# Patient Record
Sex: Male | Born: 1960 | Race: White | Hispanic: No | Marital: Married | Smoking: Former smoker
Health system: Southern US, Community
[De-identification: ages and names within clinical notes are randomized; demographics above are authoritative.]

## PROBLEM LIST (undated history)

## (undated) DIAGNOSIS — I10 Essential (primary) hypertension: Secondary | ICD-10-CM

## (undated) DIAGNOSIS — N201 Calculus of ureter: Secondary | ICD-10-CM

## (undated) DIAGNOSIS — M1990 Unspecified osteoarthritis, unspecified site: Secondary | ICD-10-CM

## (undated) DIAGNOSIS — J45909 Unspecified asthma, uncomplicated: Secondary | ICD-10-CM

## (undated) DIAGNOSIS — E7800 Pure hypercholesterolemia, unspecified: Secondary | ICD-10-CM

## (undated) DIAGNOSIS — Z87442 Personal history of urinary calculi: Secondary | ICD-10-CM

## (undated) DIAGNOSIS — J302 Other seasonal allergic rhinitis: Secondary | ICD-10-CM

## (undated) HISTORY — PX: CARPAL TUNNEL RELEASE: SHX101

## (undated) HISTORY — PX: KNEE SURGERY: SHX244

## (undated) HISTORY — PX: BACK SURGERY: SHX140

## (undated) HISTORY — PX: SHOULDER ARTHROSCOPY: SHX128

---

## 2006-09-13 ENCOUNTER — Emergency Department (HOSPITAL_COMMUNITY): Admission: EM | Admit: 2006-09-13 | Discharge: 2006-09-13 | Payer: Self-pay | Admitting: Family Medicine

## 2007-01-04 ENCOUNTER — Emergency Department (HOSPITAL_COMMUNITY): Admission: EM | Admit: 2007-01-04 | Discharge: 2007-01-04 | Payer: Self-pay | Admitting: Family Medicine

## 2007-03-18 ENCOUNTER — Emergency Department (HOSPITAL_COMMUNITY): Admission: EM | Admit: 2007-03-18 | Discharge: 2007-03-18 | Payer: Self-pay | Admitting: Emergency Medicine

## 2007-05-13 ENCOUNTER — Emergency Department (HOSPITAL_COMMUNITY): Admission: EM | Admit: 2007-05-13 | Discharge: 2007-05-13 | Payer: Self-pay | Admitting: Family Medicine

## 2008-04-07 ENCOUNTER — Emergency Department (HOSPITAL_COMMUNITY): Admission: EM | Admit: 2008-04-07 | Discharge: 2008-04-07 | Payer: Self-pay | Admitting: Emergency Medicine

## 2010-08-29 ENCOUNTER — Emergency Department (HOSPITAL_BASED_OUTPATIENT_CLINIC_OR_DEPARTMENT_OTHER)
Admission: EM | Admit: 2010-08-29 | Discharge: 2010-08-30 | Payer: Self-pay | Source: Home / Self Care | Admitting: Emergency Medicine

## 2010-09-03 LAB — URINALYSIS, ROUTINE W REFLEX MICROSCOPIC
Bilirubin Urine: NEGATIVE
Hgb urine dipstick: NEGATIVE
Ketones, ur: NEGATIVE mg/dL
Nitrite: NEGATIVE
Protein, ur: NEGATIVE mg/dL
Specific Gravity, Urine: 1.019 (ref 1.005–1.030)
Urine Glucose, Fasting: NEGATIVE mg/dL
Urobilinogen, UA: 0.2 mg/dL (ref 0.0–1.0)
pH: 6.5 (ref 5.0–8.0)

## 2015-02-14 ENCOUNTER — Encounter (HOSPITAL_BASED_OUTPATIENT_CLINIC_OR_DEPARTMENT_OTHER): Payer: Self-pay

## 2015-02-14 ENCOUNTER — Emergency Department (HOSPITAL_BASED_OUTPATIENT_CLINIC_OR_DEPARTMENT_OTHER): Payer: 59

## 2015-02-14 ENCOUNTER — Emergency Department (HOSPITAL_BASED_OUTPATIENT_CLINIC_OR_DEPARTMENT_OTHER)
Admission: EM | Admit: 2015-02-14 | Discharge: 2015-02-14 | Disposition: A | Discharge status: Home / Self Care | Payer: 59 | Attending: Emergency Medicine | Admitting: Emergency Medicine

## 2015-02-14 DIAGNOSIS — Y998 Other external cause status: Secondary | ICD-10-CM | POA: Diagnosis not present

## 2015-02-14 DIAGNOSIS — Y9289 Other specified places as the place of occurrence of the external cause: Secondary | ICD-10-CM | POA: Diagnosis not present

## 2015-02-14 DIAGNOSIS — Z87891 Personal history of nicotine dependence: Secondary | ICD-10-CM | POA: Insufficient documentation

## 2015-02-14 DIAGNOSIS — X58XXXA Exposure to other specified factors, initial encounter: Secondary | ICD-10-CM | POA: Insufficient documentation

## 2015-02-14 DIAGNOSIS — Y9389 Activity, other specified: Secondary | ICD-10-CM | POA: Diagnosis not present

## 2015-02-14 DIAGNOSIS — Z79899 Other long term (current) drug therapy: Secondary | ICD-10-CM | POA: Diagnosis not present

## 2015-02-14 DIAGNOSIS — S4991XA Unspecified injury of right shoulder and upper arm, initial encounter: Secondary | ICD-10-CM | POA: Diagnosis not present

## 2015-02-14 DIAGNOSIS — J45909 Unspecified asthma, uncomplicated: Secondary | ICD-10-CM | POA: Insufficient documentation

## 2015-02-14 HISTORY — DX: Other seasonal allergic rhinitis: J30.2

## 2015-02-14 HISTORY — DX: Unspecified asthma, uncomplicated: J45.909

## 2015-02-14 MED ORDER — HYDROCODONE-ACETAMINOPHEN 5-325 MG PO TABS: 1.0000 | ORAL_TABLET | ORAL | Status: DC | PRN

## 2015-02-14 MED ORDER — FENTANYL CITRATE (PF) 100 MCG/2ML IJ SOLN
100.0000 ug | Freq: Once | INTRAMUSCULAR | Status: AC
  Administered 2015-02-14: 100 ug via NASAL
  Filled 2015-02-14: qty 2

## 2015-02-14 NOTE — ED Notes (Signed)
Pt reports today with fall onto R shoulder, limited ROM in same since this time, numbness to fingers.

## 2015-02-14 NOTE — ED Provider Notes (Signed)
CSN: [id number]     Arrival date & time 02/14/15  1759 History   First MD Initiated Contact with Patient 02/14/15 1806     Chief Complaint  Patient presents with  . Shoulder Injury     (Consider location/radiation/quality/duration/timing/severity/associated sxs/prior Treatment) HPI Comments: 54 y/o M c/o R shoulder pain since 4 PM when he accidentally slipped while he had a backpack on his back trying to step over his tractor trailer. States he grabbed the tractor trailer with his right arm and was "basically hanging" from the tractor trailer when he felt a pop. Pain began immediately, radiating from his shoulder down his arm rated 8/10, worse when he tries to lift or twist his arm. No alleviating factors tried. Admits to numbness and tingling into his fingers. Denies neck or back pain, skin color change.  Patient is a 54 y.o. male presenting with shoulder injury. The history is provided by the patient.  Shoulder Injury This is a new problem. The current episode started today. The problem has been unchanged. Associated symptoms include numbness. Exacerbated by: lifting arm. He has tried nothing for the symptoms.    Past Medical History  Diagnosis Date  . Asthma   . Seasonal allergies    Past Surgical History  Procedure Laterality Date  . Knee surgery    . Carpal tunnel release     No family history on file. History  Substance Use Topics  . Smoking status: Former Games developermoker  . Smokeless tobacco: Not on file  . Alcohol Use: Yes     Comment: occ    Review of Systems  Musculoskeletal:       + R shoulder/arm pain.  Neurological: Positive for numbness.  All other systems reviewed and are negative.     Allergies  Review of patient's allergies indicates no known allergies.  Home Medications   Prior to Admission medications   Medication Sig Start Date End Date Taking? Authorizing Provider  cetirizine-pseudoephedrine (ZYRTEC-D) 5-120 MG per tablet Take 1 tablet by mouth 2 (two)  times daily.   Yes Historical Provider, MD  HYDROcodone-acetaminophen (NORCO/VICODIN) 5-325 MG per tablet Take 1-2 tablets by mouth every 4 (four) hours as needed. 02/14/15   Shaneya Taketa M Kaydyn Sayas, PA-C  montelukast (SINGULAIR) 10 MG tablet Take 10 mg by mouth at bedtime.   Yes Historical Provider, MD   BP 153/99 mmHg  Pulse 92  Temp(Src) 98.1 F (36.7 C) (Oral)  Resp 18  Ht 5\' 9"  (1.753 m)  Wt 210 lb (95.255 kg)  BMI 31.00 kg/m2  SpO2 100% Physical Exam  Constitutional: He is oriented to person, place, and time. He appears well-developed and well-nourished. No distress.  HENT:  Head: Normocephalic and atraumatic.  Eyes: Conjunctivae and EOM are normal.  Neck: Normal range of motion. Neck supple.  Cardiovascular: Normal rate, regular rhythm and normal heart sounds.   Pulmonary/Chest: Effort normal and breath sounds normal.  Musculoskeletal:  R arm TTP mid-proximal humerus and lateral aspect of shoulder. No deformity. ROM limited due to pain. Pain in shoulder increased when attempting shoulder abduction. Pain into humerus increased with external rotation. Elbow normal. Normal grip strength. +2 radial pulse. Cap refill < 3 seconds.  Neurological: He is alert and oriented to person, place, and time. No sensory deficit.  Skin: Skin is warm and dry.  Psychiatric: He has a normal mood and affect. His behavior is normal.  Nursing note and vitals reviewed.   ED Course  Procedures (including critical care time) Labs Review Labs  Reviewed - No data to display  Imaging Review Dg Shoulder Right  02/14/2015   CLINICAL DATA:  54 year old male status post fall.  EXAM: RIGHT SHOULDER - 2+ VIEW  COMPARISON:  None.  FINDINGS: There is no evidence of fracture or dislocation. There is no evidence of arthropathy or other focal bone abnormality. Soft tissues are unremarkable.  There is apparent increased opacity of the central portion of the visualized right lung on the frontal projection. This is not well  evaluated and may be artifactual. Correlation with chest radiograph recommended for better evaluation.  IMPRESSION: No fracture or dislocation of the right shoulder.   Electronically Signed   By: Elgie Collard M.D.   On: 02/14/2015 19:07   Dg Humerus Right  02/14/2015   CLINICAL DATA:  Status post fall, with limited range of motion and right arm pain. Initial encounter.  EXAM: RIGHT HUMERUS - 2+ VIEW  COMPARISON:  None.  FINDINGS: There is no evidence of fracture or dislocation. The humerus appears intact. The right humeral head remains seated at the glenoid fossa. The elbow joint is grossly unremarkable. No elbow joint effusion is identified. An enthesophyte is seen arising at the olecranon.  A tiny osseous fragment at the radial epicondyle likely reflects remote injury. No definite soft tissue abnormalities are characterized on radiograph. Mild degenerative change is noted at the right acromioclavicular joint.  IMPRESSION: No evidence of fracture or dislocation.   Electronically Signed   By: Roanna Raider M.D.   On: 02/14/2015 19:09     EKG Interpretation None      MDM   Final diagnoses:  Right shoulder injury, initial encounter   Neurovascularly intact. X-ray negative. Sling applied for comfort. Cannot exclude rotator cuff injury. He is seen [facility] in the past. Advised follow-up if no improvement by the end of the week. Rest, ice and NSAIDs. Vicodin for pain. Stable for discharge. Return precautions given. Patient states understanding of treatment care plan and is agreeable.    Kathrynn Speed, PA-C 02/14/15 1923  Mirian Mo, MD 02/17/15 [phone]

## 2015-02-14 NOTE — ED Notes (Signed)
PA at the bedside.

## 2015-02-14 NOTE — Discharge Instructions (Signed)
Take Vicodin for severe pain only. No driving or operating heavy machinery while taking vicodin. This medication may cause drowsiness. He should also take ibuprofen or naproxen along with the Vicodin. Rest, avoid heavy lifting or hard physical activity for the next few days. Use the sling for comfort. Follow-up with your orthopedist if no improvement by the end of the week.  Shoulder Pain The shoulder is the joint that connects your arms to your body. The bones that form the shoulder joint include the upper arm bone (humerus), the shoulder blade (scapula), and the collarbone (clavicle). The top of the humerus is shaped like a ball and fits into a rather flat socket on the scapula (glenoid cavity). A combination of muscles and strong, fibrous tissues that connect muscles to bones (tendons) support your shoulder joint and hold the ball in the socket. Small, fluid-filled sacs (bursae) are located in different areas of the joint. They act as cushions between the bones and the overlying soft tissues and help reduce friction between the gliding tendons and the bone as you move your arm. Your shoulder joint allows a wide range of motion in your arm. This range of motion allows you to do things like scratch your back or throw a ball. However, this range of motion also makes your shoulder more prone to pain from overuse and injury. Causes of shoulder pain can originate from both injury and overuse and usually can be grouped in the following four categories:  Redness, swelling, and pain (inflammation) of the tendon (tendinitis) or the bursae (bursitis).  Instability, such as a dislocation of the joint.  Inflammation of the joint (arthritis).  Broken bone (fracture). HOME CARE INSTRUCTIONS   Apply ice to the sore area.  Put ice in a plastic bag.  Place a towel between your skin and the bag.  Leave the ice on for 15-20 minutes, 3-4 times per day for the first 2 days, or as directed by your health care  provider.  Stop using cold packs if they do not help with the pain.  If you have a shoulder sling or immobilizer, wear it as long as your caregiver instructs. Only remove it to shower or bathe. Move your arm as little as possible, but keep your hand moving to prevent swelling.  Squeeze a soft ball or foam pad as much as possible to help prevent swelling.  Only take over-the-counter or prescription medicines for pain, discomfort, or fever as directed by your caregiver. SEEK MEDICAL CARE IF:   Your shoulder pain increases, or new pain develops in your arm, hand, or fingers.  Your hand or fingers become cold and numb.  Your pain is not relieved with medicines. SEEK IMMEDIATE MEDICAL CARE IF:   Your arm, hand, or fingers are numb or tingling.  Your arm, hand, or fingers are significantly swollen or turn white or blue. MAKE SURE YOU:   Understand these instructions.  Will watch your condition.  Will get help right away if you are not doing well or get worse. Document Released: 05/08/2005 Document Revised: 12/13/2013 Document Reviewed: 07/13/2011 [facility] Patient Information 2015 [place], [place]. This information is not intended to replace advice given to you by your health care provider. Make sure you discuss any questions you have with your health care provider.

## 2015-02-14 NOTE — ED Notes (Signed)
Patient transported to X-ray 

## 2015-02-14 NOTE — ED Notes (Signed)
Patient returned from X-ray 

## 2018-10-02 ENCOUNTER — Emergency Department (HOSPITAL_BASED_OUTPATIENT_CLINIC_OR_DEPARTMENT_OTHER): Payer: 59

## 2018-10-02 ENCOUNTER — Telehealth: Payer: Self-pay | Admitting: *Deleted

## 2018-10-02 ENCOUNTER — Encounter (HOSPITAL_BASED_OUTPATIENT_CLINIC_OR_DEPARTMENT_OTHER): Payer: Self-pay | Admitting: Emergency Medicine

## 2018-10-02 ENCOUNTER — Other Ambulatory Visit: Payer: Self-pay

## 2018-10-02 ENCOUNTER — Emergency Department (HOSPITAL_BASED_OUTPATIENT_CLINIC_OR_DEPARTMENT_OTHER)
Admission: EM | Admit: 2018-10-02 | Discharge: 2018-10-02 | Disposition: A | Discharge status: Home / Self Care | Payer: 59 | Attending: Emergency Medicine | Admitting: Emergency Medicine

## 2018-10-02 DIAGNOSIS — R1084 Generalized abdominal pain: Secondary | ICD-10-CM | POA: Diagnosis present

## 2018-10-02 DIAGNOSIS — J45909 Unspecified asthma, uncomplicated: Secondary | ICD-10-CM | POA: Diagnosis not present

## 2018-10-02 DIAGNOSIS — Z87891 Personal history of nicotine dependence: Secondary | ICD-10-CM | POA: Insufficient documentation

## 2018-10-02 DIAGNOSIS — N201 Calculus of ureter: Secondary | ICD-10-CM | POA: Diagnosis not present

## 2018-10-02 HISTORY — DX: Calculus of ureter: N20.1

## 2018-10-02 HISTORY — DX: Pure hypercholesterolemia, unspecified: E78.00

## 2018-10-02 LAB — URINALYSIS, MICROSCOPIC (REFLEX): RBC / HPF: >50 RBC/hpf (ref 0–5)

## 2018-10-02 LAB — URINALYSIS, ROUTINE W REFLEX MICROSCOPIC
Bilirubin Urine: NEGATIVE
Glucose, UA: NEGATIVE mg/dL
Ketones, ur: NEGATIVE mg/dL
Leukocytes,Ua: NEGATIVE
Nitrite: NEGATIVE
Protein, ur: NEGATIVE mg/dL
Specific Gravity, Urine: >1.03 — ABNORMAL HIGH (ref 1.005–1.030)
pH: 6 (ref 5.0–8.0)

## 2018-10-02 MED ORDER — TAMSULOSIN HCL 0.4 MG PO CAPS
0.4000 mg | ORAL_CAPSULE | Freq: Once | ORAL | Status: AC
  Administered 2018-10-02: 0.4 mg via ORAL
  Filled 2018-10-02: qty 1

## 2018-10-02 MED ORDER — HYDROMORPHONE HCL 1 MG/ML IJ SOLN
1.0000 mg | Freq: Once | INTRAMUSCULAR | Status: AC
  Administered 2018-10-02: 1 mg via INTRAVENOUS
  Filled 2018-10-02: qty 1

## 2018-10-02 MED ORDER — ONDANSETRON 8 MG PO TBDP: 8.0000 mg | ORAL_TABLET | Freq: Three times a day (TID) | ORAL | 1 refills | Status: DC | PRN

## 2018-10-02 MED ORDER — HYDROMORPHONE HCL 1 MG/ML IJ SOLN
1.0000 mg | Freq: Once | INTRAMUSCULAR | Status: AC
Start: 2018-10-02 — End: 2018-10-02
  Administered 2018-10-02: 1 mg via INTRAVENOUS
  Filled 2018-10-02: qty 1

## 2018-10-02 MED ORDER — TAMSULOSIN HCL 0.4 MG PO CAPS: ORAL_CAPSULE | ORAL | 0 refills | Status: DC

## 2018-10-02 MED ORDER — HYDROMORPHONE HCL 4 MG PO TABS: 2.0000 mg | ORAL_TABLET | ORAL | 0 refills | Status: DC | PRN

## 2018-10-02 MED ORDER — ONDANSETRON HCL 4 MG/2ML IJ SOLN
4.0000 mg | Freq: Once | INTRAMUSCULAR | Status: AC
  Administered 2018-10-02: 4 mg via INTRAVENOUS
  Filled 2018-10-02: qty 2

## 2018-10-02 NOTE — Telephone Encounter (Signed)
Pt spouse called in regards to 4mg  Dilaudid tablets prescribed. Wife states tablets at really small and they are unable to score them.  They have contacted pharmacy and pharmacy suggested they call ED for new Rx.  EDCM advised that changing narcotic Rx over the phone is not permitted.  EDCM also informed pt that EDP that prescribed Rx was not available as pt was seen at [facility].   Pt wife states they will continue with attempting to score the 4mg  tablet.

## 2018-10-02 NOTE — ED Provider Notes (Signed)
[facility] Provider Note: Joe Dell, MD, FACEP  CSN: [id number] MRN: [id number] ARRIVAL: 10/02/18 at 0053 ROOM: [facility]   CHIEF COMPLAINT  Flank Pain   HISTORY OF PRESENT ILLNESS  10/02/18 1:00 AM Joe [name] is a 58 y.o. male with a history of kidney stones.  He was here with right flank pain radiating to his right lower quadrant for about the past 90 minutes.  The onset was sudden. He rates the pain as severe.  It is not significantly changed with movement or palpation.  He has had associated nausea but no vomiting.  He has not noticed blood in his urine.  Pain is similar to previous kidney stones.   Past Medical History:  Diagnosis Date  . Asthma   . Hypercholesteremia   . Seasonal allergies   . Ureterolithiasis     Past Surgical History:  Procedure Laterality Date  . CARPAL TUNNEL RELEASE    . KNEE SURGERY      No family history on file.  Social History   Tobacco Use  . Smoking status: Former Smoker  Substance Use Topics  . Alcohol use: Yes    Comment: occ  . Drug use: No    Prior to Admission medications   Medication Sig Start Date End Date Taking? Authorizing Provider  montelukast (SINGULAIR) 10 MG tablet Take 10 mg by mouth at bedtime.   Yes [provider]  HYDROmorphone (DILAUDID) 4 MG tablet Take 0.5 tablets (2 mg total) by mouth every 4 (four) hours as needed for severe pain. 10/02/18   Jadalynn Burr, Jonny Ruiz, MD  ondansetron (ZOFRAN ODT) 8 MG disintegrating tablet Take 1 tablet (8 mg total) by mouth every 8 (eight) hours as needed for nausea or vomiting. 10/02/18   Jaicee Michelotti, MD  tamsulosin (FLOMAX) 0.4 MG CAPS capsule Take 1 capsule daily until stone passes. 10/02/18   Florencio Hollibaugh, Jonny Ruiz, MD    Allergies Patient has no known allergies.   REVIEW OF SYSTEMS  Negative except as noted here or in the History of Present Illness.   PHYSICAL EXAMINATION  Initial Vital Signs Blood pressure (!) 202/102, pulse 73, resp. rate 20, height  5\' 9"  (1.753 m), weight 99.3 kg, SpO2 99 %.  Examination General: Well-developed, well-nourished male in no acute distress; appearance consistent with age of record HENT: normocephalic; atraumatic Eyes: pupils equal, round and reactive to light; extraocular muscles intact Neck: supple Heart: regular rate and rhythm Lungs: clear to auscultation bilaterally Abdomen: soft; nondistended; mild right lower quadrant tenderness bowel sounds present GU: No CVA tenderness Extremities: No deformity; full range of motion; pulses normal Neurologic: Awake, alert and oriented; motor function intact in all extremities and symmetric; no facial droop Skin: Warm and dry Psychiatric: Grimacing   RESULTS  Summary of this visit's results, reviewed by myself:   EKG Interpretation  Date/Time:    Ventricular Rate:    PR Interval:    QRS Duration:   QT Interval:    QTC Calculation:   R Axis:     Text Interpretation:        Laboratory Studies: Results for orders placed or performed during the hospital encounter of 10/02/18 (from the past 24 hour(s))  Urinalysis, Routine w reflex microscopic     Status: Abnormal   Collection Time: 10/02/18 12:58 AM  Result Value Ref Range   Color, Urine YELLOW YELLOW   APPearance HAZY (A) CLEAR   Specific Gravity, Urine >1.030 (H) 1.005 - 1.030   pH 6.0 5.0 -  8.0   Glucose, UA NEGATIVE NEGATIVE mg/dL   Hgb urine dipstick LARGE (A) NEGATIVE   Bilirubin Urine NEGATIVE NEGATIVE   Ketones, ur NEGATIVE NEGATIVE mg/dL   Protein, ur NEGATIVE NEGATIVE mg/dL   Nitrite NEGATIVE NEGATIVE   Leukocytes,Ua NEGATIVE NEGATIVE  Urinalysis, Microscopic (reflex)     Status: Abnormal   Collection Time: 10/02/18 12:58 AM  Result Value Ref Range   RBC / HPF >50 0 - 5 RBC/hpf   WBC, UA 0-5 0 - 5 WBC/hpf   Bacteria, UA FEW (A) NONE SEEN   Squamous Epithelial / LPF 0-5 0 - 5   Mucus PRESENT    Imaging Studies: Ct Renal Stone Study  Result Date: 10/02/2018 CLINICAL DATA:   Right-sided flank pain with hematuria EXAM: CT ABDOMEN AND PELVIS WITHOUT CONTRAST TECHNIQUE: Multidetector CT imaging of the abdomen and pelvis was performed following the standard protocol without IV contrast. COMPARISON:  08/29/2010 CT FINDINGS: Lower chest: Lung bases demonstrate no acute consolidation or effusion. The heart size is normal. Hepatobiliary: No focal liver abnormality is seen. No gallstones, gallbladder wall thickening, or biliary dilatation. Pancreas: Unremarkable. No pancreatic ductal dilatation or surrounding inflammatory changes. Spleen: Normal in size without focal abnormality. Adrenals/Urinary Tract: Adrenal glands are normal. No left hydronephrosis. Mild right hydronephrosis, secondary to a 5 mm stone in the proximal right ureter just past the right UPJ. Additional 3 mm stone in the lower pole of the right kidney. Bladder is normal Stomach/Bowel: Stomach is within normal limits. Appendix appears normal. No evidence of bowel wall thickening, distention, or inflammatory changes. Mild sigmoid colon diverticular disease without acute inflammatory change Vascular/Lymphatic: Mild aortic atherosclerosis without aneurysm. No significant adenopathy Reproductive: Prostate is unremarkable. Other: Negative for free air or free fluid. Small fat in the inguinal canals. Musculoskeletal: No acute or significant osseous findings. IMPRESSION: 1. Mild right hydronephrosis, secondary to a 5 mm stone in the proximal right ureter just distal to the right UPJ. 2. Additional small stone in the lower pole of the right kidney 3. Mild sigmoid colon diverticular disease without acute inflammatory process Electronically Signed   By: Jasmine Pang M.D.   On: 10/02/2018 01:41    ED COURSE and MDM  Nursing notes and initial vitals signs, including pulse oximetry, reviewed.  Vitals:   10/02/18 0058 10/02/18 0059  BP:  (!) 202/102  Pulse:  73  Resp:  20  SpO2:  99%  Weight: 99.3 kg   Height: 5\' 9"  (1.753 m)     1:55 AM Pain down to a 5 out of 10.  Will refer patient to urology as his stone is large enough he may not pass it on his own.  PROCEDURES    ED DIAGNOSES     ICD-10-CM   1. Ureterolithiasis N20.1        Mansel Strother, MD 10/02/18 [phone]

## 2018-10-02 NOTE — ED Triage Notes (Signed)
Pt with sudden onset of right flank pain with nausea x 1 hour

## 2018-10-16 ENCOUNTER — Other Ambulatory Visit: Payer: Self-pay | Admitting: Urology

## 2018-10-21 NOTE — H&P (Signed)
Office Visit Report     10/13/2018   --------------------------------------------------------------------------------   Joe [name]  MRN: [id number]  PRIMARY CARE:  Minette Brine, MD  DOB: 06/18/1961, 58 year old Male  REFERRING:  John L. Molpus, MD  SSN:   PROVIDER:  Raynelle Bring, M.D.    TREATING:  Azucena Fallen    LOCATION:  [facility] [phone]   --------------------------------------------------------------------------------   CC/HPI: Right ureteral calculus   10/02/18: Joe [name] is a 58 year old gentleman who presents today having a 1 day history of severe right-sided flank pain with radiation to his right lower quadrant. He presented to [facility] yesterday. A CT scan revealed a 5 mm proximal right ureteral stone. He also had a small lower pole nonobstructing stone on the right side. He has had nausea but no vomiting. He denies any fever. He does have 1 prior stone episode 8 years ago when he spontaneously passed a small 1 mm stone. He does not know his stone type.   Currently, his pain is relatively well controlled with hydromorphone. He has been given tamsulosin and a strainer. He also has a prescription for Zofran.    10/13/18: Joe [name] returns today for follow up. He states that he did pass some small stones in the interim. He denies any current flank or abdominal pain. He state that he did have an episode of left back pain on one occasion, which subsided after a few hours. He denies exacerbation of lower urinary tract symptoms, gross hematuria, fevers, chills, nausea, or vomiting. He denies past cardiac history. He is not on blood thinners.     ALLERGIES: None   MEDICATIONS: Albuterol Sulfate  Montelukast Sodium  Rosuvastatin Calcium     GU PSH: None   NON-GU PSH: Shoulder Surgery (Unspecified), Right Wrist Arthroscopy/surgery, Left    GU PMH: Ureteral calculus - 10/02/2018    NON-GU PMH:  Asthma Hypercholesterolemia    FAMILY HISTORY: Heart Disease - Father liver cancer - Mother    Notes: 2 daughters   SOCIAL HISTORY: Marital Status: Married Preferred Language: English; Ethnicity: Not Hispanic Or Latino; Race: White Current Smoking Status: Patient smokes. Smokes 1/2 pack per day.   Tobacco Use Assessment Completed: Used Tobacco in last 30 days? Drinks 1 drink per day.  Drinks 4+ caffeinated drinks per day.    REVIEW OF SYSTEMS:    GU Review Male:   Patient denies frequent urination, hard to postpone urination, burning/ pain with urination, get up at night to urinate, leakage of urine, stream starts and stops, trouble starting your stream, have to strain to urinate , erection problems, and penile pain.  Gastrointestinal (Upper):   Patient denies nausea, vomiting, and indigestion/ heartburn.  Gastrointestinal (Lower):   Patient denies diarrhea and constipation.  Constitutional:   Patient denies fever, night sweats, weight loss, and fatigue.  Skin:   Patient denies skin rash/ lesion and itching.  Eyes:   Patient denies blurred vision and double vision.  Ears/ Nose/ Throat:   Patient denies sore throat and sinus problems.  Hematologic/Lymphatic:   Patient denies swollen glands and easy bruising.  Cardiovascular:   Patient denies leg swelling and chest pains.  Respiratory:   Patient denies cough and shortness of breath.  Endocrine:   Patient denies excessive thirst.  Musculoskeletal:   Patient denies back pain and joint pain.  Neurological:   Patient denies headaches and dizziness.  Psychologic:   Patient denies depression and anxiety.  VITAL SIGNS:      10/13/2018 08:27 AM  Weight 215 lb / 97.52 kg  Height 69 in / 175.26 cm  BP 151/96 mmHg  Heart Rate 65 /min  Temperature 98.1 F / 36.7 C  BMI 31.7 kg/m   MULTI-SYSTEM PHYSICAL EXAMINATION:    Constitutional: Well-nourished. No physical deformities. Normally developed. Good grooming.  Respiratory: No labored  breathing, no use of accessory muscles. Clear bilaterally.  Cardiovascular: Normal temperature, normal extremity pulses, no swelling, no varicosities. Regular rate and rhythm.  Neurologic / Psychiatric: Oriented to time, oriented to place, oriented to person. No depression, no anxiety, no agitation.  Gastrointestinal: No mass, no tenderness, no rigidity, non obese abdomen. No CVAT.   Musculoskeletal: Normal gait and station of head and neck.     PAST DATA REVIEWED:  Source Of History:  Patient  Records Review:   Previous Patient Records  Urine Test Review:   Urinalysis  X-Ray Review: C.T. Abdomen/Pelvis: Reviewed Films. Reviewed Report.     PROCEDURES:         KUB - [id number]  A single view of the abdomen is obtained. No obvious opacities noted within the confines of the left renal shadow or along the expected anatomical course of the left ureter. Stable opacity noted within the confines of the right lower pole. Along the expected anatomical course of the right ureter, there continues to be an approximatly 5 mm opacity in the proximal ureter, marked with an arrow on KUB imaging today. This is consistent with calculus noted on past imaging study and appears to have progressed minimally. Normal appearing overlying bowel gas pattern.       Patient confirmed No Neulasta OnPro Device.            Urinalysis w/Scope Dipstick Dipstick Cont'd Micro  Color: Yellow Bilirubin: Neg mg/dL WBC/hpf: 0 - 5/hpf  Appearance: Clear Ketones: Neg mg/dL RBC/hpf: 0 - 2/hpf  Specific Gravity: 1.025 Blood: Trace ery/uL Bacteria: NS (Not Seen)  pH: 5.5 Protein: Neg mg/dL Cystals: NS (Not Seen)  Glucose: Neg mg/dL Urobilinogen: 0.2 mg/dL Casts: NS (Not Seen)    Nitrites: Neg Trichomonas: Not Present    Leukocyte Esterase: Neg leu/uL Mucous: Not Present      Epithelial Cells: 0 - 5/hpf      Yeast: NS (Not Seen)      Sperm: Not Present    ASSESSMENT:      ICD-10 Details  1 GU:   Ureteral calculus - N20.1     PLAN:           Orders Labs Urine Culture          Schedule X-Rays: 2 Weeks - KUB  Return Visit/Planned Activity: Other See Visit Notes          Document Letter(s):  Created for Patient: Clinical Summary         Notes:   I will send urine for culture today. On KUB imaging today, there are no obvious opacities noted within the confines of the left renal shadow or along the expected anatomical course of the left ureter. Stable opacity noted within the confines of the right lower pole. Along the expected anatomical course of the right ureter, there continues to be an approximatly 5 mm opacity in the proximal ureter, marked with an arrow on KUB imaging today. This is consistent with calculus noted on past imaging study and appears to have progressed minimally. We reviewed treatment options today including: continued observation with MET, ESWL, or ureteroscopy.   -  For ureteroscopy I described the risks  which include general anesthetic complications, bleeding,  infection, damage to contiguous structures, positioning injury, ureteral  stricture, ureteral avulsion, ureteral injury, need for ureteral stent,  inability to perform ureteroscopy, need for an interval procedure, inability to  clear stone burden, stent discomfort and pain.   -For shockwave lithotripsy  I described the risks which include arrhythmia, kidney contusion, kidney  hemorrhage, need for transfusion, back discomfort, flank ecchymosis, flank abrasion, inability to break up stone, inability to pass stone fragments, Steinstrasse, infection associated with obstructing stones, need for different surgical procedure and possible need for repeat shockwave lithotripsy.   He seems most interested in proceeding with right ESWL. I will review imaging studies with his urologist. I will keep him informed of the plan moving forward. He will continue MET in the meantime. I encouraged that he continue to hydrate well and strain his urine.  Return precuations reviewed for fevers or progressive pain, nausea, or vomiting. He voiced understanding.      * Signed by Azucena Fallen on 10/13/18 at 9:16 AM (EST)*     The information contained in this medical record document is considered private and confidential patient information. This information can only be used for the medical diagnosis and/or medical services that are being provided by the patient's selected caregivers. This information can only be distributed outside of the patient's care if the patient agrees and signs waivers of authorization for this information to be sent to an outside source or route.  Addendum: I reviewed the chart, labs and imaging.  5 mm right proximal stone.

## 2018-10-22 ENCOUNTER — Ambulatory Visit (HOSPITAL_COMMUNITY): Payer: 59

## 2018-10-22 ENCOUNTER — Encounter (HOSPITAL_COMMUNITY): Payer: Self-pay | Admitting: General Practice

## 2018-10-22 ENCOUNTER — Encounter (HOSPITAL_COMMUNITY)
Admission: RE | Disposition: A | Discharge status: Home / Self Care | Payer: Self-pay | Source: Other Acute Inpatient Hospital | Attending: Urology

## 2018-10-22 ENCOUNTER — Ambulatory Visit (HOSPITAL_COMMUNITY)
Admission: RE | Admit: 2018-10-22 | Discharge: 2018-10-22 | Disposition: A | Discharge status: Home / Self Care | Payer: 59 | Source: Other Acute Inpatient Hospital | Attending: Urology | Admitting: Urology

## 2018-10-22 DIAGNOSIS — F17210 Nicotine dependence, cigarettes, uncomplicated: Secondary | ICD-10-CM | POA: Insufficient documentation

## 2018-10-22 DIAGNOSIS — Z79899 Other long term (current) drug therapy: Secondary | ICD-10-CM | POA: Diagnosis not present

## 2018-10-22 DIAGNOSIS — N201 Calculus of ureter: Secondary | ICD-10-CM | POA: Diagnosis not present

## 2018-10-22 HISTORY — PX: EXTRACORPOREAL SHOCK WAVE LITHOTRIPSY: SHX1557

## 2018-10-22 HISTORY — DX: Personal history of urinary calculi: Z87.442

## 2018-10-22 SURGERY — LITHOTRIPSY, ESWL
Anesthesia: LOCAL | Laterality: Right

## 2018-10-22 MED ORDER — DIPHENHYDRAMINE HCL 25 MG PO CAPS
25.0000 mg | ORAL_CAPSULE | ORAL | Status: AC
  Administered 2018-10-22: 25 mg via ORAL
  Filled 2018-10-22: qty 1

## 2018-10-22 MED ORDER — HYDROMORPHONE HCL 4 MG PO TABS: 2.0000 mg | ORAL_TABLET | ORAL | 0 refills | Status: DC | PRN

## 2018-10-22 MED ORDER — TAMSULOSIN HCL 0.4 MG PO CAPS: ORAL_CAPSULE | ORAL | 0 refills | Status: DC

## 2018-10-22 MED ORDER — HYDROMORPHONE HCL 2 MG PO TABS: 2.0000 mg | ORAL_TABLET | ORAL | 0 refills | Status: DC | PRN

## 2018-10-22 MED ORDER — CIPROFLOXACIN HCL 500 MG PO TABS
500.0000 mg | ORAL_TABLET | ORAL | Status: AC
  Administered 2018-10-22: 500 mg via ORAL
  Filled 2018-10-22: qty 1

## 2018-10-22 MED ORDER — DIAZEPAM 5 MG PO TABS
10.0000 mg | ORAL_TABLET | ORAL | Status: AC
  Administered 2018-10-22: 10 mg via ORAL
  Filled 2018-10-22: qty 2

## 2018-10-22 MED ORDER — SODIUM CHLORIDE 0.9 % IV SOLN
INTRAVENOUS | Status: DC
  Administered 2018-10-22: 11:00:00 via INTRAVENOUS

## 2018-10-22 NOTE — Op Note (Addendum)
Right proximal stone, 3mm   Right ESWL   Findings: Right stone targeted well and faded slightly. He may need a staged procedure if he doesn't pass the stone/fragments.   I spoke to his wife. She also asked for dilaudid 2 mg tabs so they wouldn't have to take 1/2 a 4 mg. I changed the Rx and we discussed proper dosing. Also refilled tamsulosin.

## 2018-10-22 NOTE — Interval H&P Note (Signed)
History and Physical Interval Note:  10/22/2018 11:38 AM  Joe [name]  has presented today for surgery, with the diagnosis of RIGHT URETERAL CALCULUS.  The various methods of treatment have been discussed with the patient and family. After consideration of risks, benefits and other options for treatment, the patient has consented to  Procedure(s): EXTRACORPOREAL SHOCK WAVE LITHOTRIPSY (ESWL) (Right) as a surgical intervention.  The patient's history has been reviewed, patient examined, no change in status, stable for surgery. Stone remain right prox ureter. Pt is well with no cough, fever or dysuria.   I have reviewed the patient's chart and labs.  Questions were answered to the patient's satisfaction.     Jerilee Field

## 2018-10-22 NOTE — Discharge Instructions (Signed)

## 2018-10-23 ENCOUNTER — Encounter (HOSPITAL_COMMUNITY): Payer: Self-pay | Admitting: Urology

## 2019-04-09 ENCOUNTER — Emergency Department (HOSPITAL_BASED_OUTPATIENT_CLINIC_OR_DEPARTMENT_OTHER): Payer: 59

## 2019-04-09 ENCOUNTER — Other Ambulatory Visit: Payer: Self-pay

## 2019-04-09 ENCOUNTER — Emergency Department (HOSPITAL_BASED_OUTPATIENT_CLINIC_OR_DEPARTMENT_OTHER)
Admission: EM | Admit: 2019-04-09 | Discharge: 2019-04-10 | Disposition: A | Discharge status: Home / Self Care | Payer: 59 | Attending: Emergency Medicine | Admitting: Emergency Medicine

## 2019-04-09 ENCOUNTER — Encounter (HOSPITAL_BASED_OUTPATIENT_CLINIC_OR_DEPARTMENT_OTHER): Payer: Self-pay

## 2019-04-09 DIAGNOSIS — F17210 Nicotine dependence, cigarettes, uncomplicated: Secondary | ICD-10-CM | POA: Insufficient documentation

## 2019-04-09 DIAGNOSIS — N201 Calculus of ureter: Secondary | ICD-10-CM | POA: Diagnosis not present

## 2019-04-09 DIAGNOSIS — R1032 Left lower quadrant pain: Secondary | ICD-10-CM | POA: Diagnosis present

## 2019-04-09 DIAGNOSIS — N23 Unspecified renal colic: Secondary | ICD-10-CM

## 2019-04-09 DIAGNOSIS — Z79899 Other long term (current) drug therapy: Secondary | ICD-10-CM | POA: Insufficient documentation

## 2019-04-09 LAB — COMPREHENSIVE METABOLIC PANEL
ALT: 32 U/L (ref 0–44)
AST: 22 U/L (ref 15–41)
Albumin: 4.4 g/dL (ref 3.5–5.0)
Alkaline Phosphatase: 83 U/L (ref 38–126)
Anion gap: 10 (ref 5–15)
BUN: 20 mg/dL (ref 6–20)
CO2: 28 mmol/L (ref 22–32)
Calcium: 9.5 mg/dL (ref 8.9–10.3)
Chloride: 104 mmol/L (ref 98–111)
Creatinine, Ser: 1.15 mg/dL (ref 0.61–1.24)
GFR calc Af Amer: >60 mL/min (ref >60)
GFR calc non Af Amer: >60 mL/min (ref >60)
Glucose, Bld: 92 mg/dL (ref 70–99)
Potassium: 3.9 mmol/L (ref 3.5–5.1)
Sodium: 142 mmol/L (ref 135–145)
Total Bilirubin: 0.5 mg/dL (ref 0.3–1.2)
Total Protein: 7.2 g/dL (ref 6.5–8.1)

## 2019-04-09 LAB — URINALYSIS, ROUTINE W REFLEX MICROSCOPIC
Glucose, UA: NEGATIVE mg/dL
Ketones, ur: 15 mg/dL — AB
Leukocytes,Ua: NEGATIVE
Nitrite: NEGATIVE
Protein, ur: 30 mg/dL — AB
Specific Gravity, Urine: 1.025 (ref 1.005–1.030)
pH: 6 (ref 5.0–8.0)

## 2019-04-09 LAB — URINALYSIS, MICROSCOPIC (REFLEX)
Bacteria, UA: NONE SEEN
RBC / HPF: >50 RBC/hpf (ref 0–5)
Squamous Epithelial / HPF: NONE SEEN (ref 0–5)

## 2019-04-09 LAB — CBC WITH DIFFERENTIAL/PLATELET
Abs Immature Granulocytes: 0.03 10*3/uL (ref 0.00–0.07)
Basophils Absolute: 0.1 10*3/uL (ref 0.0–0.1)
Basophils Relative: 1 %
Eosinophils Absolute: 0.2 10*3/uL (ref 0.0–0.5)
Eosinophils Relative: 2 %
HCT: 47 % (ref 39.0–52.0)
Hemoglobin: 15.6 g/dL (ref 13.0–17.0)
Immature Granulocytes: 0 %
Lymphocytes Relative: 19 %
Lymphs Abs: 2 10*3/uL (ref 0.7–4.0)
MCH: 30.4 pg (ref 26.0–34.0)
MCHC: 33.2 g/dL (ref 30.0–36.0)
MCV: 91.6 fL (ref 80.0–100.0)
Monocytes Absolute: 0.9 10*3/uL (ref 0.1–1.0)
Monocytes Relative: 8 %
Neutro Abs: 7.1 10*3/uL (ref 1.7–7.7)
Neutrophils Relative %: 70 %
Platelets: 234 10*3/uL (ref 150–400)
RBC: 5.13 MIL/uL (ref 4.22–5.81)
RDW: 12.6 % (ref 11.5–15.5)
WBC: 10.3 10*3/uL (ref 4.0–10.5)
nRBC: 0 % (ref 0.0–0.2)

## 2019-04-09 MED ORDER — TAMSULOSIN HCL 0.4 MG PO CAPS: ORAL_CAPSULE | ORAL | 0 refills | Status: AC

## 2019-04-09 MED ORDER — OXYCODONE-ACETAMINOPHEN 5-325 MG PO TABS: 1.0000 | ORAL_TABLET | ORAL | 0 refills | Status: DC | PRN

## 2019-04-09 MED ORDER — ONDANSETRON HCL 4 MG/2ML IJ SOLN
4.0000 mg | Freq: Once | INTRAMUSCULAR | Status: AC
  Administered 2019-04-09: 4 mg via INTRAVENOUS
  Filled 2019-04-09: qty 2

## 2019-04-09 MED ORDER — TAMSULOSIN HCL 0.4 MG PO CAPS: ORAL_CAPSULE | ORAL | 0 refills | Status: DC

## 2019-04-09 MED ORDER — MORPHINE SULFATE (PF) 4 MG/ML IV SOLN
4.0000 mg | Freq: Once | INTRAVENOUS | Status: AC
  Administered 2019-04-09: 4 mg via INTRAVENOUS
  Filled 2019-04-09: qty 1

## 2019-04-09 MED ORDER — SODIUM CHLORIDE 0.9 % IV BOLUS
1000.0000 mL | Freq: Once | INTRAVENOUS | Status: AC
  Administered 2019-04-09: 1000 mL via INTRAVENOUS

## 2019-04-09 NOTE — ED Notes (Signed)
ED Provider at bedside. 

## 2019-04-09 NOTE — ED Notes (Signed)
Patient transported to CT 

## 2019-04-09 NOTE — ED Provider Notes (Signed)
Patient signed out to me by Dr. Darl Householder to follow-up on CT scan.  Patient presented with sudden onset flank pain similar to previous renal colic.  Kidney stone is confirmed by CT.  Patient feeling better after analgesia, will discharge with analgesia, Flomax, follow-up with urology.   Orpah Greek, MD 04/09/19 [phone]

## 2019-04-09 NOTE — ED Provider Notes (Addendum)
[place] EMER[facility]ENCY DEPARTMENT Provider Note   CSN: [id number] Arrival date & time: 04/09/19  2117     History   Chief Complaint Chief Complaint  Patient presents with   Abdominal Pain    HPI Joe [name] is a 58 y.o. male history of asthma, kidney stones who presents for evaluation of left lower quadrant pain that began about an hour prior to ED arrival.  Patient states that he was eating dinner when also he started developing some left lower quadrant abdominal pain.  He states that it is on his side and radiates to the front.  He states it feels like his previous episodes of kidney stones.  He has not noted any dysuria, hematuria, nausea/vomiting.  He states he had a little bit of pain about a week ago but states it resolved until today's episode.  He has not noted any fevers, nausea/vomiting. He denies any CP, SOB.  Patient with lithotripsy in March 2020 for evaluation.      The history is provided by the patient.    Past Medical History:  Diagnosis Date   Asthma    History of kidney stones    Hypercholesteremia    Seasonal allergies    Ureterolithiasis     There are no active problems to display for this patient.   Past Surgical History:  Procedure Laterality Date   CARPAL TUNNEL RELEASE     EXTRACORPOREAL SHOCK WAVE LITHOTRIPSY Right 10/22/2018   Procedure: EXTRACORPOREAL SHOCK WAVE LITHOTRIPSY (ESWL);  Surgeon: Festus Aloe, MD;  Location: [facility];  Service: Urology;  Laterality: Right;   KNEE SURGERY     SHOULDER ARTHROSCOPY Right         Home Medications    Prior to Admission medications   Medication Sig Start Date End Date Taking? Authorizing Provider  Albuterol Sulfate (VENTOLIN HFA IN) Inhale 90 mcg into the lungs as needed.    [provider]  meloxicam (MOBIC) 15 MG tablet Take 15 mg by mouth daily.    [provider]  montelukast (SINGULAIR) 10 MG tablet Take 10 mg by mouth at bedtime.    [provider]  ondansetron (ZOFRAN ODT) 8 MG disintegrating tablet Take 1 tablet (8 mg total) by mouth every 8 (eight) hours as needed for nausea or vomiting. 10/02/18   Molpus, Jenny Reichmann, MD  oxyCODONE-acetaminophen (PERCOCET) 5-325 MG tablet Take 1-2 tablets by mouth every 4 (four) hours as needed. 04/09/19   Orpah Greek, MD  rosuvastatin (CRESTOR) 5 MG tablet Take 5 mg by mouth daily.    [provider]  tamsulosin (FLOMAX) 0.4 MG CAPS capsule Take 1 tablet daily until kidney stone passes 04/09/19   Pollina, Gwenyth Allegra, MD    Family History No family history on file.  Social History Social History   Tobacco Use   Smoking status: Current Some Day Smoker    Packs/day: 0.50    Years: 15.00    Pack years: 7.50    Types: Cigarettes   Smokeless tobacco: Never Used  Substance Use Topics   Alcohol use: Yes    Comment: occ   Drug use: No     Allergies   Patient has no known allergies.   Review of Systems Review of Systems  Constitutional: Negative for fever.  Respiratory: Negative for cough and shortness of breath.   Cardiovascular: Negative for chest pain.  Gastrointestinal: Negative for abdominal pain, nausea and vomiting.  Genitourinary: Positive for flank pain. Negative for dysuria and  hematuria.  Neurological: Negative for headaches.  All other systems reviewed and are negative.    Physical Exam Updated Vital Signs BP (!) 154/88 (BP Location: Left Arm)    Pulse 68    Temp 97.7 F (36.5 C) (Oral)    Resp 16    Ht 5\' 9"  (1.753 m)    Wt 96.2 kg    SpO2 99%    BMI 31.31 kg/m   Physical Exam Vitals signs and nursing note reviewed.  Constitutional:      Appearance: Normal appearance. He is well-developed.  HENT:     Head: Normocephalic and atraumatic.  Eyes:     General: Lids are normal.     Conjunctiva/sclera: Conjunctivae normal.     Pupils: Pupils are equal, round, and reactive to light.  Neck:     Musculoskeletal: Full passive range of  motion without pain.  Cardiovascular:     Rate and Rhythm: Normal rate and regular rhythm.     Pulses: Normal pulses.     Heart sounds: Normal heart sounds. No murmur. No friction rub. No gallop.   Pulmonary:     Effort: Pulmonary effort is normal.     Breath sounds: Normal breath sounds.     Comments: Lungs clear to auscultation bilaterally.  Symmetric chest rise.  No wheezing, rales, rhonchi. Abdominal:     Palpations: Abdomen is soft. Abdomen is not rigid.     Tenderness: There is abdominal tenderness in the left lower quadrant. There is left CVA tenderness. There is no right CVA tenderness or guarding.     Comments: Abdomen is soft, nondistended.  He has diffuse tenderness to the left lower quadrant.  He has some mild left-sided CVA tenderness but mostly palpation of the CVA area causes pain to the side in front of his abdomen.  Musculoskeletal: Normal range of motion.  Skin:    General: Skin is warm and dry.     Capillary Refill: Capillary refill takes less than 2 seconds.  Neurological:     Mental Status: He is alert and oriented to person, place, and time.  Psychiatric:        Speech: Speech normal.      ED Treatments / Results  Labs (all labs ordered are listed, but only abnormal results are displayed) Labs Reviewed  URINALYSIS, ROUTINE W REFLEX MICROSCOPIC - Abnormal; Notable for the following components:      Result Value   Color, Urine AMBER (*)    APPearance CLOUDY (*)    Hgb urine dipstick LARGE (*)    Bilirubin Urine SMALL (*)    Ketones, ur 15 (*)    Protein, ur 30 (*)    All other components within normal limits  CBC WITH DIFFERENTIAL/PLATELET  COMPREHENSIVE METABOLIC PANEL  URINALYSIS, MICROSCOPIC (REFLEX)    EKG None  Radiology Ct Renal Stone Study  Result Date: 04/09/2019 CLINICAL DATA:  58 year old male with left lower quadrant abdominal pain. EXAM: CT ABDOMEN AND PELVIS WITHOUT CONTRAST TECHNIQUE: Multidetector CT imaging of the abdomen and pelvis  was performed following the standard protocol without IV contrast. COMPARISON:  CT dated 10/02/2018 FINDINGS: Evaluation of this exam is limited in the absence of intravenous contrast. Lower chest: The visualized lung bases are clear. No intra-abdominal free air or free fluid. Hepatobiliary: There is fatty infiltration of the liver. No intrahepatic biliary ductal dilatation. No calcified gallstone Pancreas: Unremarkable. No pancreatic ductal dilatation or surrounding inflammatory changes. Spleen: Normal in size without focal abnormality. Adrenals/Urinary Tract: The adrenal  glands are unremarkable. The previously seen stone at the right ureteropelvic junction has passed. There is no hydronephrosis or nephrolithiasis on the right. There is a 3 mm stone in the distal left ureter, new since the prior CT. There is minimal left hydronephrosis. The urinary bladder is unremarkable. No stone identified within the bladder. Stomach/Bowel: There is no bowel obstruction or active inflammation. The appendix is normal. Vascular/Lymphatic: Mild aortoiliac atherosclerotic disease. The IVC is grossly unremarkable. No portal venous gas. There is no adenopathy. Reproductive: The prostate and seminal vesicles are grossly unremarkable. No pelvic mass. Other: Small fat containing umbilical hernia. Musculoskeletal: No acute or significant osseous findings. IMPRESSION: 1. A 3 mm distal left ureteral stone with minimal left hydronephrosis. 2. Interval passage of the previously seen right UPJ calculus and resolution of the previously seen hydronephrosis on the right. 3. Fatty liver. 4. No bowel obstruction or active inflammation. Normal appendix. Aortic Atherosclerosis (ICD10-I70.0). Electronically Signed   By: Elgie Collard M.D.   On: 04/09/2019 23:02    Procedures Procedures (including critical care time)  Medications Ordered in ED Medications  ondansetron [facility]) injection 4 mg (4 mg Intravenous Given 04/09/19 2203)  morphine 4  MG/ML injection 4 mg (4 mg Intravenous Given 04/09/19 2203)  sodium chloride 0.9 % bolus 1,000 mL ( Intravenous Stopped 04/09/19 2308)     Initial Impression / Assessment and Plan / ED Course  I have reviewed the triage vital signs and the nursing notes.  Pertinent labs & imaging results that were available during my care of the patient were reviewed by me and considered in my medical decision making (see chart for details).        58 year old male who presents for evaluation of left lower quadrant abdominal pain that began about an hour ago.  No urinary complaints, nausea/vomiting.  Reports history of kidney stones and states this feels similar. Patient is afebrile, non-toxic appearing, sitting comfortably on examination table. Vital signs reviewed and stable.  On exam, tenderness palpation left lower quadrant.  We will plan to check labs, urine.  Concern for kidney stone versus infectious process.  Patient signed out to Dr. Silverio Lay with labs and imaging.   Portions of this note were generated with Scientist, clinical (histocompatibility and immunogenetics). Dictation errors may occur despite best attempts at proofreading.   Final Clinical Impressions(s) / ED Diagnoses   Final diagnoses:  Renal colic on left side    ED Discharge Orders         Ordered    tamsulosin (FLOMAX) 0.4 MG CAPS capsule  Status:  Discontinued     04/09/19 2352    oxyCODONE-acetaminophen (PERCOCET) 5-325 MG tablet  Every 4 hours PRN,   Status:  Discontinued     04/09/19 2352    oxyCODONE-acetaminophen (PERCOCET) 5-325 MG tablet  Every 4 hours PRN     04/09/19 2353    tamsulosin (FLOMAX) 0.4 MG CAPS capsule     04/09/19 2353           Maxwell Caul, PA-C 04/09/19 2149    Maxwell Caul, PA-C 04/10/19 1230    Charlynne Pander, MD 04/13/19 2042

## 2019-04-09 NOTE — ED Triage Notes (Signed)
Pt c/o LLQ pain x 1 hour-states feels like a kidney stone-NAD-steady gait

## 2019-04-09 NOTE — ED Notes (Signed)
Pt unable to give urine sample at this time 

## 2019-09-27 NOTE — H&P (Signed)
TOTAL KNEE ADMISSION H&P  Patient is being admitted for left total knee arthroplasty.  Subjective:  Chief Complaint:left knee pain.  HPI: [name], 59 y.o. male, has a history of pain and functional disability in the left knee due to arthritis and has failed non-surgical conservative treatments for greater than 12 weeks to includeNSAID's and/or analgesics, corticosteriod injections, viscosupplementation injections and flexibility and strengthening excercises.  Onset of symptoms was gradual, starting 1 years ago with gradually worsening course since that time. The patient noted no past surgery on the left knee(s).  Patient currently rates pain in the left knee(s) at 8 out of 10 with activity. Patient has night pain, worsening of pain with activity and weight bearing, pain that interferes with activities of daily living, pain with passive range of motion, crepitus and joint swelling.  Patient has evidence of subchondral sclerosis, periarticular osteophytes and joint space narrowing by imaging studies. This patient has had no previous injury. There is no active infection.  There are no problems to display for this patient.  Past Medical History:  Diagnosis Date  . Asthma   . History of kidney stones   . Hypercholesteremia   . Seasonal allergies   . Ureterolithiasis     Past Surgical History:  Procedure Laterality Date  . CARPAL TUNNEL RELEASE    . EXTRACORPOREAL SHOCK WAVE LITHOTRIPSY Right 10/22/2018   Procedure: EXTRACORPOREAL SHOCK WAVE LITHOTRIPSY (ESWL);  Surgeon: Jerilee Field, MD;  Location: [facility];  Service: Urology;  Laterality: Right;  . KNEE SURGERY    . SHOULDER ARTHROSCOPY Right     No current facility-administered medications for this encounter.   Current Outpatient Medications  Medication Sig Dispense Refill Last Dose  . cetirizine (ZYRTEC) 10 MG tablet Take 10 mg by mouth daily.     Marland Kitchen esomeprazole (NEXIUM) 20 MG capsule Take 20 mg by mouth daily at 12 noon.      . montelukast (SINGULAIR) 10 MG tablet Take 10 mg by mouth at bedtime.     . rosuvastatin (CRESTOR) 5 MG tablet Take 5 mg by mouth daily.     . ondansetron (ZOFRAN ODT) 8 MG disintegrating tablet Take 1 tablet (8 mg total) by mouth every 8 (eight) hours as needed for nausea or vomiting. (Patient not taking: Reported on 09/21/2019) 10 tablet 1 Not Taking at Unknown time  . oxyCODONE-acetaminophen (PERCOCET) 5-325 MG tablet Take 1-2 tablets by mouth every 4 (four) hours as needed. (Patient not taking: Reported on 09/21/2019) 20 tablet 0 Not Taking at Unknown time  . tamsulosin (FLOMAX) 0.4 MG CAPS capsule Take 1 tablet daily until kidney stone passes (Patient not taking: Reported on 09/21/2019) 20 capsule 0 Not Taking at Unknown time   No Known Allergies  Social History   Tobacco Use  . Smoking status: Current Some Day Smoker    Packs/day: 0.50    Years: 15.00    Pack years: 7.50    Types: Cigarettes  . Smokeless tobacco: Never Used  Substance Use Topics  . Alcohol use: Yes    Comment: occ    No family history on file.   Review of Systems  Constitutional: Negative.   HENT: Negative.   Eyes: Negative.   Respiratory: Negative.   Gastrointestinal: Negative.   Endocrine: Negative.   Genitourinary: Negative.   Musculoskeletal: Positive for arthralgias and joint swelling.  Skin: Negative.   Allergic/Immunologic: Negative.   Neurological: Negative.   Hematological: Negative.   Psychiatric/Behavioral: Negative.     Objective:  Physical  Exam  Constitutional: He is oriented to person, place, and time. He appears well-developed and well-nourished.  HENT:  Head: Normocephalic and atraumatic.  Neck: No JVD present. No thyromegaly present.  Cardiovascular: Normal rate, regular rhythm, normal heart sounds and intact distal pulses. Exam reveals no gallop and no friction rub.  No murmur heard. Respiratory: Effort normal and breath sounds normal. No respiratory distress. He has no wheezes. He  has no rales. He exhibits no tenderness.  GI: He exhibits no distension and no mass. There is no abdominal tenderness. There is no guarding.  Musculoskeletal:     Cervical back: Normal range of motion and neck supple.     Comments: On exam of his left knee no skin changes or effusions noted.  Tenderness To palpation of the medial and lateral joint line.   Full extension, flexion to about 95.   4-5 strength with resisted knee flexion and extension.  Calf is supple.   Neurovascularly intact in his left lower extremity.  Neurological: He is alert and oriented to person, place, and time.  Skin: Skin is warm and dry. No rash noted. No erythema. No pallor.  Psychiatric: He has a normal mood and affect. His behavior is normal. Judgment and thought content normal.    Vital signs in last 24 hours: BP: ()/()  Arterial Line BP: ()/()   Labs:   Estimated body mass index is 31.31 kg/m as calculated from the following:   Height as of 04/09/19: 5\' 9"  (1.753 m).   Weight as of 04/09/19: 96.2 kg.   Imaging Review Plain radiographs demonstrate severe degenerative joint disease of the left knee(s). The overall alignment ismild varus. The bone quality appears to be good for age and reported activity level.      Assessment/Plan:  End stage arthritis, left knee   The patient history, physical examination, clinical judgment of the provider and imaging studies are consistent with end stage degenerative joint disease of the left knee(s) and total knee arthroplasty is deemed medically necessary. The treatment options including medical management, injection therapy arthroscopy and arthroplasty were discussed at length. The risks and benefits of total knee arthroplasty were presented and reviewed. The risks due to aseptic loosening, infection, stiffness, patella tracking problems, thromboembolic complications and other imponderables were discussed. The patient acknowledged the explanation, agreed to proceed  with the plan and consent was signed. Patient is being admitted for inpatient treatment for surgery, pain control, PT, OT, prophylactic antibiotics, VTE prophylaxis, progressive ambulation and ADL's and discharge planning. The patient is planning to be discharged home with home health services   Patient will be set up for outpatient PT  Patient's anticipated LOS is less than 2 midnights, meeting these requirements: - Younger than 81 - Lives within 1 hour of care - Has a competent adult at home to recover with post-op recover - NO history of  - Chronic pain requiring opiods  - Diabetes  - Coronary Artery Disease  - Heart failure  - Heart attack  - Stroke  - DVT/VTE  - Cardiac arrhythmia  - Respiratory Failure/COPD  - Renal failure  - Anemia  - Advanced Liver disease

## 2019-09-27 NOTE — Patient Instructions (Addendum)
DUE TO COVID-19 ONLY ONE VISITOR IS ALLOWED TO COME WITH YOU AND STAY IN THE WAITING ROOM ONLY DURING PRE OP AND PROCEDURE DAY OF SURGERY. THE 1 VISITOR MAY VISIT WITH YOU AFTER SURGERY IN YOUR PRIVATE ROOM DURING VISITING HOURS ONLY!  YOU NEED TO HAVE A COVID 19 TEST ON__ 02/16/2021_____ @__11 :10 am_____, THIS TEST MUST BE DONE BEFORE SURGERY, COME  [address], [place] , [address].  [name]) ONCE YOUR COVID TEST IS COMPLETED, PLEASE BEGIN THE QUARANTINE INSTRUCTIONS AS OUTLINED IN YOUR HANDOUT.                [name]    Your procedure is scheduled on: Friday 10/01/2019   Report to [name] Main  Entrance    Report to admitting at  0805  AM     Call this number if you have problems the morning of surgery [phone]                DO NOT SMOKE CIGARETTES OR CHEW TOBACCO 24 hours before surgery!    Remember: Do not eat food  :After Midnight.    NO SOLID FOOD AFTER MIDNIGHT THE NIGHT PRIOR TO SURGERY  And  NOTHING BY MOUTH EXCEPT CLEAR LIQUIDS UNTIL  0735 am .     PLEASE FINISH ENSURE DRINK PER SURGEON ORDER  WHICH NEEDS TO BE COMPLETED AT 0735 am .   CLEAR LIQUID DIET   Foods Allowed                                                                     Foods Excluded  Coffee and tea, regular and decaf                             liquids that you cannot  Plain Jell-O any favor except red or purple                                           see through such as: Fruit ices (not with fruit pulp)                                     milk, soups, orange juice  Iced Popsicles                                    All solid food Carbonated beverages, regular and diet                                    Cranberry, grape and apple juices Sports drinks like Gatorade Lightly seasoned clear broth or consume(fat free) Sugar, honey syrup  Sample Menu Breakfast                                Lunch  Supper Cranberry juice                    Beef broth                            Chicken broth Jell-O                                     Grape juice                           Apple juice Coffee or tea                        Jell-O                                      Popsicle                                                Coffee or tea                        Coffee or tea  _____________________________________________________________________      BRUSH YOUR TEETH MORNING OF SURGERY AND RINSE YOUR MOUTH OUT, NO CHEWING GUM CANDY OR MINTS.     Take these medicines the morning of surgery with A SIP OF WATER:  Rosuvastatin (Crestor), Montelukast (Singulair), Esomeprazole (Nexium)                                 You may not have any metal on your body including hair pins and              piercings  Do not wear jewelry, make-up, lotions, powders or perfumes, deodorant                          Men may shave face and neck.   Do not bring valuables to the [name] [place].  Contacts, dentures or bridgework may not be worn into surgery.  Leave suitcase in the car. After surgery it may be brought to your room.                  Please read over the following fact sheets you were given: _____________________________________________________________________             [facility] - Preparing for Surgery Before surgery, you can play an important role.  Because skin is not sterile, your skin needs to be as free of germs as possible.  You can reduce the number of germs on your skin by washing with CHG (chlorahexidine gluconate) soap before surgery.  CHG is an antiseptic cleaner which kills germs and bonds with the skin to continue killing germs even after washing. Please DO NOT use if you have an allergy to CHG or antibacterial soaps.  If your skin becomes reddened/irritated stop using the  CHG and inform your nurse when you arrive at Short  Stay. Do not shave (including legs and underarms) for at least 48 hours prior to the first CHG shower.  You may shave your face/neck. Please follow these instructions carefully:  1.  Shower with CHG Soap the night before surgery and the  morning of Surgery.  2.  If you choose to wash your hair, wash your hair first as usual with your  normal  shampoo.  3.  After you shampoo, rinse your hair and body thoroughly to remove the  shampoo.                           4.  Use CHG as you would any other liquid soap.  You can apply chg directly  to the skin and wash                       Gently with a scrungie or clean washcloth.  5.  Apply the CHG Soap to your body ONLY FROM THE NECK DOWN.   Do not use on face/ open                           Wound or open sores. Avoid contact with eyes, ears mouth and genitals (private parts).                       Wash face,  Genitals (private parts) with your normal soap.             6.  Wash thoroughly, paying special attention to the area where your surgery  will be performed.  7.  Thoroughly rinse your body with warm water from the neck down.  8.  DO NOT shower/wash with your normal soap after using and rinsing off  the CHG Soap.                9.  Pat yourself dry with a clean towel.            10.  Wear clean pajamas.            11.  Place clean sheets on your bed the night of your first shower and do not  sleep with pets. Day of Surgery : Do not apply any lotions/deodorants the morning of surgery.  Please wear clean clothes to the [name] center.  FAILURE TO FOLLOW THESE INSTRUCTIONS MAY RESULT IN THE CANCELLATION OF YOUR SURGERY PATIENT SIGNATURE_________________________________  NURSE SIGNATURE__________________________________  ________________________________________________________________________   [name]  An incentive spirometer is a tool that can help keep your lungs clear and active. This tool measures how well you are  filling your lungs with each breath. Taking long deep breaths may help reverse or decrease the chance of developing breathing (pulmonary) problems (especially infection) following:  A long period of time when you are unable to move or be active. BEFORE THE PROCEDURE   If the spirometer includes an indicator to show your best effort, your nurse or respiratory therapist will set it to a desired goal.  If possible, sit up straight or lean slightly forward. Try not to slouch.  Hold the incentive spirometer in an upright position. INSTRUCTIONS FOR USE  1. Sit on the edge of your bed if possible, or sit up as far as you can in bed or on a chair. 2. Hold the incentive spirometer in  an upright position. 3. Breathe out normally. 4. Place the mouthpiece in your mouth and seal your lips tightly around it. 5. Breathe in slowly and as deeply as possible, raising the piston or the ball toward the top of the column. 6. Hold your breath for 3-5 seconds or for as long as possible. Allow the piston or ball to fall to the bottom of the column. 7. Remove the mouthpiece from your mouth and breathe out normally. 8. Rest for a few seconds and repeat Steps 1 through 7 at least 10 times every 1-2 hours when you are awake. Take your time and take a few normal breaths between deep breaths. 9. The spirometer may include an indicator to show your best effort. Use the indicator as a goal to work toward during each repetition. 10. After each set of 10 deep breaths, practice coughing to be sure your lungs are clear. If you have an incision (the cut made at the time of surgery), support your incision when coughing by placing a pillow or rolled up towels firmly against it. Once you are able to get out of bed, walk around indoors and cough well. You may stop using the incentive spirometer when instructed by your caregiver.  RISKS AND COMPLICATIONS  Take your time so you do not get dizzy or light-headed.  If you are in pain,  you may need to take or ask for pain medication before doing incentive spirometry. It is harder to take a deep breath if you are having pain. AFTER USE  Rest and breathe slowly and easily.  It can be helpful to keep track of a log of your progress. Your caregiver can provide you with a simple table to help with this. If you are using the spirometer at home, follow these instructions: SEEK MEDICAL CARE IF:   You are having difficultly using the spirometer.  You have trouble using the spirometer as often as instructed.  Your pain medication is not giving enough relief while using the spirometer.  You develop fever of 100.5 F (38.1 C) or higher. SEEK IMMEDIATE MEDICAL CARE IF:   You cough up bloody sputum that had not been present before.  You develop fever of 102 F (38.9 C) or greater.  You develop worsening pain at or near the incision site. MAKE SURE YOU:   Understand these instructions.  Will watch your condition.  Will get help right away if you are not doing well or get worse. Document Released: 12/09/2006 Document Revised: 10/21/2011 Document Reviewed: 02/09/2007 Exit[employer] Patient Information 2014 [employer], [place].   ________________________________________________________________________  WHAT IS A BLOOD TRANSFUSION? Blood Transfusion Information  A transfusion is the replacement of blood or some of its parts. Blood is made up of multiple cells which provide different functions.  Red blood cells carry oxygen and are used for blood loss replacement.  White blood cells fight against infection.  Platelets control bleeding.  Plasma helps clot blood.  Other blood products are available for specialized needs, such as hemophilia or other clotting disorders. BEFORE THE TRANSFUSION  Who gives blood for transfusions?   Healthy volunteers who are fully evaluated to make sure their blood is safe. This is blood bank blood. Transfusion therapy is the safest it has ever  been in the practice of medicine. Before blood is taken from a donor, a complete history is taken to make sure that person has no history of diseases nor engages in risky social behavior (examples are intravenous drug use or sexual activity  with multiple partners). The donor's travel history is screened to minimize risk of transmitting infections, such as malaria. The donated blood is tested for signs of infectious diseases, such as HIV and hepatitis. The blood is then tested to be sure it is compatible with you in order to minimize the chance of a transfusion reaction. If you or a relative donates blood, this is often done in anticipation of surgery and is not appropriate for emergency situations. It takes many days to process the donated blood. RISKS AND COMPLICATIONS Although transfusion therapy is very safe and saves many lives, the main dangers of transfusion include:   Getting an infectious disease.  Developing a transfusion reaction. This is an allergic reaction to something in the blood you were given. Every precaution is taken to prevent this. The decision to have a blood transfusion has been considered carefully by your caregiver before blood is given. Blood is not given unless the benefits outweigh the risks. AFTER THE TRANSFUSION  Right after receiving a blood transfusion, you will usually feel much better and more energetic. This is especially true if your red blood cells have gotten low (anemic). The transfusion raises the level of the red blood cells which carry oxygen, and this usually causes an energy increase.  The nurse administering the transfusion will monitor you carefully for complications. HOME CARE INSTRUCTIONS  No special instructions are needed after a transfusion. You may find your energy is better. Speak with your caregiver about any limitations on activity for underlying diseases you may have. SEEK MEDICAL CARE IF:   Your condition is not improving after your  transfusion.  You develop redness or irritation at the intravenous (IV) site. SEEK IMMEDIATE MEDICAL CARE IF:  Any of the following symptoms occur over the next 12 hours:  Shaking chills.  You have a temperature by mouth above 102 F (38.9 C), not controlled by medicine.  Chest, back, or muscle pain.  People around you feel you are not acting correctly or are confused.  Shortness of breath or difficulty breathing.  Dizziness and fainting.  You get a rash or develop hives.  You have a decrease in urine output.  Your urine turns a dark color or changes to pink, red, or brown. Any of the following symptoms occur over the next 10 days:  You have a temperature by mouth above 102 F (38.9 C), not controlled by medicine.  Shortness of breath.  Weakness after normal activity.  The white part of the eye turns yellow (jaundice).  You have a decrease in the amount of urine or are urinating less often.  Your urine turns a dark color or changes to pink, red, or brown. Document Released: 07/26/2000 Document Revised: 10/21/2011 Document Reviewed: 03/14/2008 [name] Patient Information 2014 [place], [place].  _______________________________________________________________________

## 2019-09-28 ENCOUNTER — Other Ambulatory Visit: Payer: Self-pay

## 2019-09-28 ENCOUNTER — Other Ambulatory Visit (HOSPITAL_COMMUNITY)
Admission: RE | Admit: 2019-09-28 | Discharge: 2019-09-28 | Disposition: A | Discharge status: Home / Self Care | Payer: 59 | Source: Ambulatory Visit | Attending: Specialist | Admitting: Specialist

## 2019-09-28 ENCOUNTER — Encounter (HOSPITAL_COMMUNITY)
Admission: RE | Admit: 2019-09-28 | Discharge: 2019-09-28 | Disposition: A | Discharge status: Home / Self Care | Payer: 59 | Source: Ambulatory Visit | Attending: Specialist | Admitting: Specialist

## 2019-09-28 ENCOUNTER — Encounter (HOSPITAL_COMMUNITY): Payer: Self-pay

## 2019-09-28 DIAGNOSIS — Z01812 Encounter for preprocedural laboratory examination: Secondary | ICD-10-CM | POA: Insufficient documentation

## 2019-09-28 DIAGNOSIS — Z20822 Contact with and (suspected) exposure to covid-19: Secondary | ICD-10-CM | POA: Diagnosis not present

## 2019-09-28 HISTORY — DX: Unspecified osteoarthritis, unspecified site: M19.90

## 2019-09-28 LAB — COMPREHENSIVE METABOLIC PANEL
ALT: 54 U/L — ABNORMAL HIGH (ref 0–44)
AST: 25 U/L (ref 15–41)
Albumin: 4.4 g/dL (ref 3.5–5.0)
Alkaline Phosphatase: 82 U/L (ref 38–126)
Anion gap: 8 (ref 5–15)
BUN: 14 mg/dL (ref 6–20)
CO2: 28 mmol/L (ref 22–32)
Calcium: 9.4 mg/dL (ref 8.9–10.3)
Chloride: 106 mmol/L (ref 98–111)
Creatinine, Ser: 0.99 mg/dL (ref 0.61–1.24)
GFR calc Af Amer: >60 mL/min (ref >60)
GFR calc non Af Amer: >60 mL/min (ref >60)
Glucose, Bld: 108 mg/dL — ABNORMAL HIGH (ref 70–99)
Potassium: 4.1 mmol/L (ref 3.5–5.1)
Sodium: 142 mmol/L (ref 135–145)
Total Bilirubin: 0.6 mg/dL (ref 0.3–1.2)
Total Protein: 7.6 g/dL (ref 6.5–8.1)

## 2019-09-28 LAB — CBC
HCT: 48.6 % (ref 39.0–52.0)
Hemoglobin: 16.3 g/dL (ref 13.0–17.0)
MCH: 30.2 pg (ref 26.0–34.0)
MCHC: 33.5 g/dL (ref 30.0–36.0)
MCV: 90.2 fL (ref 80.0–100.0)
Platelets: 204 10*3/uL (ref 150–400)
RBC: 5.39 MIL/uL (ref 4.22–5.81)
RDW: 12.4 % (ref 11.5–15.5)
WBC: 7.2 10*3/uL (ref 4.0–10.5)
nRBC: 0 % (ref 0.0–0.2)

## 2019-09-28 LAB — APTT: aPTT: 27 seconds (ref 24–36)

## 2019-09-28 LAB — PROTIME-INR
INR: 0.9 (ref 0.8–1.2)
Prothrombin Time: 12.1 seconds (ref 11.4–15.2)

## 2019-09-28 LAB — ABO/RH: ABO/RH(D): O POS

## 2019-09-28 LAB — SURGICAL PCR SCREEN
MRSA, PCR: NEGATIVE
Staphylococcus aureus: NEGATIVE

## 2019-09-28 LAB — SARS CORONAVIRUS 2 (TAT 6-24 HRS): SARS Coronavirus 2: NEGATIVE

## 2019-09-28 NOTE — Progress Notes (Signed)
PCP - Dr. Johny Blamer Cardiologist - n/a  Chest x-ray - n/a EKG - n/a Stress Test - n/a ECHO -n/a  Cardiac Cath - n/a  Sleep Study -n/a  CPAP - n/a  Fasting Blood Sugar - n/a Checks Blood Sugar ___0__ times a day  Blood Thinner Instructions:n/a Aspirin Instructions:n/a Last Dose:n/a  Anesthesia review:   Patient denies shortness of breath, fever, cough and chest pain at PAT appointment   Patient verbalized understanding of instructions that were given to them at the PAT appointment. Patient was also instructed that they will need to review over the PAT instructions again at home before surgery.

## 2019-09-30 MED ORDER — BUPIVACAINE LIPOSOME 1.3 % IJ SUSP
20.0000 mL | Freq: Once | INTRAMUSCULAR | Status: DC
  Filled 2019-09-30: qty 20

## 2019-09-30 NOTE — Progress Notes (Signed)
Anesthesia Chart Review  Elevated BP at PAT visit.  No h/o HTN.  Pt reports his blood pressure is typically normal.  He was seen by PCP 1.5 weeks ago and reports normal BP at this time.  He is asymptomatic at this time.  Pt advised to contact PCP.  Pt understands risk of cancellation if BP is too high DOS.   Janey Genta [facility] Pre-Surgical Testing [phone] 09/30/19  1:48 PM

## 2019-10-01 ENCOUNTER — Encounter (HOSPITAL_COMMUNITY): Payer: Self-pay | Admitting: Specialist

## 2019-10-01 ENCOUNTER — Observation Stay (HOSPITAL_COMMUNITY)
Admission: RE | Admit: 2019-10-01 | Discharge: 2019-10-02 | Disposition: A | Discharge status: Home / Self Care | Payer: 59 | Attending: Specialist | Admitting: Specialist

## 2019-10-01 ENCOUNTER — Ambulatory Visit (HOSPITAL_COMMUNITY): Payer: 59 | Admitting: Physician Assistant

## 2019-10-01 ENCOUNTER — Ambulatory Visit (HOSPITAL_COMMUNITY): Payer: 59 | Admitting: Anesthesiology

## 2019-10-01 ENCOUNTER — Other Ambulatory Visit: Payer: Self-pay

## 2019-10-01 ENCOUNTER — Encounter (HOSPITAL_COMMUNITY)
Admission: RE | Disposition: A | Discharge status: Home / Self Care | Payer: Self-pay | Source: Home / Self Care | Attending: Specialist

## 2019-10-01 DIAGNOSIS — J45909 Unspecified asthma, uncomplicated: Secondary | ICD-10-CM | POA: Insufficient documentation

## 2019-10-01 DIAGNOSIS — K219 Gastro-esophageal reflux disease without esophagitis: Secondary | ICD-10-CM | POA: Diagnosis not present

## 2019-10-01 DIAGNOSIS — F17210 Nicotine dependence, cigarettes, uncomplicated: Secondary | ICD-10-CM | POA: Diagnosis not present

## 2019-10-01 DIAGNOSIS — Z79899 Other long term (current) drug therapy: Secondary | ICD-10-CM | POA: Diagnosis not present

## 2019-10-01 DIAGNOSIS — M1712 Unilateral primary osteoarthritis, left knee: Principal | ICD-10-CM | POA: Insufficient documentation

## 2019-10-01 DIAGNOSIS — E7800 Pure hypercholesterolemia, unspecified: Secondary | ICD-10-CM | POA: Insufficient documentation

## 2019-10-01 DIAGNOSIS — Z87442 Personal history of urinary calculi: Secondary | ICD-10-CM | POA: Diagnosis not present

## 2019-10-01 HISTORY — PX: TOTAL KNEE ARTHROPLASTY: SHX125

## 2019-10-01 LAB — CBC
HCT: 46 % (ref 39.0–52.0)
Hemoglobin: 15.5 g/dL (ref 13.0–17.0)
MCH: 30.6 pg (ref 26.0–34.0)
MCHC: 33.7 g/dL (ref 30.0–36.0)
MCV: 90.7 fL (ref 80.0–100.0)
Platelets: 193 10*3/uL (ref 150–400)
RBC: 5.07 MIL/uL (ref 4.22–5.81)
RDW: 12.3 % (ref 11.5–15.5)
WBC: 6.3 10*3/uL (ref 4.0–10.5)
nRBC: 0 % (ref 0.0–0.2)

## 2019-10-01 LAB — TYPE AND SCREEN
ABO/RH(D): O POS
Antibody Screen: NEGATIVE

## 2019-10-01 LAB — CREATININE, SERUM
Creatinine, Ser: 1.14 mg/dL (ref 0.61–1.24)
GFR calc Af Amer: >60 mL/min
GFR calc non Af Amer: >60 mL/min

## 2019-10-01 LAB — HIV ANTIBODY (ROUTINE TESTING W REFLEX): HIV Screen 4th Generation wRfx: NONREACTIVE

## 2019-10-01 SURGERY — ARTHROPLASTY, KNEE, TOTAL
Anesthesia: Spinal | Site: Knee | Laterality: Left

## 2019-10-01 MED ORDER — METHOCARBAMOL 500 MG PO TABS
500.0000 mg | ORAL_TABLET | Freq: Four times a day (QID) | ORAL | Status: DC | PRN
  Administered 2019-10-01 – 2019-10-02 (×2): 500 mg via ORAL
  Filled 2019-10-01 (×2): qty 1

## 2019-10-01 MED ORDER — LIDOCAINE HCL (CARDIAC) PF 100 MG/5ML IV SOSY
PREFILLED_SYRINGE | INTRAVENOUS | Status: DC | PRN
  Administered 2019-10-01: 40 mg via INTRAVENOUS

## 2019-10-01 MED ORDER — SODIUM CHLORIDE 0.9 % IR SOLN
Status: DC | PRN
  Administered 2019-10-01: 1000 mL

## 2019-10-01 MED ORDER — GABAPENTIN 300 MG PO CAPS
300.0000 mg | ORAL_CAPSULE | Freq: Three times a day (TID) | ORAL | Status: DC
  Administered 2019-10-01 – 2019-10-02 (×3): 300 mg via ORAL
  Filled 2019-10-01 (×3): qty 1

## 2019-10-01 MED ORDER — MEPERIDINE HCL 50 MG/ML IJ SOLN: 6.2500 mg | INTRAMUSCULAR | Status: DC | PRN

## 2019-10-01 MED ORDER — DIPHENHYDRAMINE HCL 12.5 MG/5ML PO ELIX: 12.5000 mg | ORAL_SOLUTION | ORAL | Status: DC | PRN

## 2019-10-01 MED ORDER — HYDROMORPHONE HCL 1 MG/ML IJ SOLN: 0.2500 mg | INTRAMUSCULAR | Status: DC | PRN

## 2019-10-01 MED ORDER — ACETAMINOPHEN 325 MG PO TABS: 325.0000 mg | ORAL_TABLET | Freq: Four times a day (QID) | ORAL | Status: DC | PRN

## 2019-10-01 MED ORDER — ROPIVACAINE HCL 7.5 MG/ML IJ SOLN
INTRAMUSCULAR | Status: DC | PRN
  Administered 2019-10-01: 20 mL via PERINEURAL

## 2019-10-01 MED ORDER — CEFAZOLIN SODIUM-DEXTROSE 2-4 GM/100ML-% IV SOLN
2.0000 g | INTRAVENOUS | Status: AC
  Administered 2019-10-01: 2 g via INTRAVENOUS
  Filled 2019-10-01: qty 100

## 2019-10-01 MED ORDER — ACETAMINOPHEN 500 MG PO TABS
1000.0000 mg | ORAL_TABLET | Freq: Four times a day (QID) | ORAL | Status: AC
  Administered 2019-10-01 – 2019-10-02 (×4): 1000 mg via ORAL
  Filled 2019-10-01 (×4): qty 2

## 2019-10-01 MED ORDER — ACETAMINOPHEN 160 MG/5ML PO SOLN: 325.0000 mg | Freq: Once | ORAL | Status: DC | PRN

## 2019-10-01 MED ORDER — PROMETHAZINE HCL 25 MG/ML IJ SOLN: 6.2500 mg | INTRAMUSCULAR | Status: DC | PRN

## 2019-10-01 MED ORDER — CEFAZOLIN SODIUM-DEXTROSE 1-4 GM/50ML-% IV SOLN
1.0000 g | Freq: Three times a day (TID) | INTRAVENOUS | Status: DC
  Administered 2019-10-01 – 2019-10-02 (×2): 1 g via INTRAVENOUS
  Filled 2019-10-01 (×2): qty 50

## 2019-10-01 MED ORDER — POVIDONE-IODINE 10 % EX SWAB
2.0000 "application " | Freq: Once | CUTANEOUS | Status: AC
  Administered 2019-10-01: 2 via TOPICAL

## 2019-10-01 MED ORDER — PROPOFOL 500 MG/50ML IV EMUL
INTRAVENOUS | Status: AC
  Filled 2019-10-01: qty 50

## 2019-10-01 MED ORDER — BISACODYL 5 MG PO TBEC: 5.0000 mg | DELAYED_RELEASE_TABLET | Freq: Every day | ORAL | Status: DC | PRN

## 2019-10-01 MED ORDER — ONDANSETRON HCL 4 MG PO TABS: 4.0000 mg | ORAL_TABLET | Freq: Every day | ORAL | 1 refills | Status: AC | PRN

## 2019-10-01 MED ORDER — OXYCODONE HCL 5 MG PO TABS
10.0000 mg | ORAL_TABLET | ORAL | Status: DC | PRN
  Administered 2019-10-01: 10 mg via ORAL

## 2019-10-01 MED ORDER — ONDANSETRON HCL 4 MG/2ML IJ SOLN
INTRAMUSCULAR | Status: DC | PRN
  Administered 2019-10-01: 4 mg via INTRAVENOUS

## 2019-10-01 MED ORDER — MIDAZOLAM HCL 2 MG/2ML IJ SOLN
1.0000 mg | Freq: Once | INTRAMUSCULAR | Status: AC
  Administered 2019-10-01: 2 mg via INTRAVENOUS
  Filled 2019-10-01: qty 2

## 2019-10-01 MED ORDER — FENTANYL CITRATE (PF) 100 MCG/2ML IJ SOLN
INTRAMUSCULAR | Status: AC
  Filled 2019-10-01: qty 2

## 2019-10-01 MED ORDER — 0.9 % SODIUM CHLORIDE (POUR BTL) OPTIME
TOPICAL | Status: DC | PRN
  Administered 2019-10-01: 1000 mL

## 2019-10-01 MED ORDER — DEXAMETHASONE SODIUM PHOSPHATE 10 MG/ML IJ SOLN
8.0000 mg | Freq: Once | INTRAMUSCULAR | Status: AC
  Administered 2019-10-01: 8 mg via INTRAVENOUS

## 2019-10-01 MED ORDER — LACTATED RINGERS IV SOLN: INTRAVENOUS | Status: DC

## 2019-10-01 MED ORDER — ENOXAPARIN SODIUM 40 MG/0.4ML ~~LOC~~ SOLN
40.0000 mg | Freq: Two times a day (BID) | SUBCUTANEOUS | Status: DC
  Administered 2019-10-01 – 2019-10-02 (×2): 40 mg via SUBCUTANEOUS
  Filled 2019-10-01 (×2): qty 0.4

## 2019-10-01 MED ORDER — MIDAZOLAM HCL 2 MG/2ML IJ SOLN
INTRAMUSCULAR | Status: AC
  Filled 2019-10-01: qty 2

## 2019-10-01 MED ORDER — DEXAMETHASONE SODIUM PHOSPHATE 10 MG/ML IJ SOLN
INTRAMUSCULAR | Status: AC
  Filled 2019-10-01: qty 1

## 2019-10-01 MED ORDER — FENTANYL CITRATE (PF) 100 MCG/2ML IJ SOLN
INTRAMUSCULAR | Status: DC | PRN
  Administered 2019-10-01: 25 ug via INTRAVENOUS
  Administered 2019-10-01: 50 ug via INTRAVENOUS

## 2019-10-01 MED ORDER — FENTANYL CITRATE (PF) 100 MCG/2ML IJ SOLN
25.0000 ug | Freq: Once | INTRAMUSCULAR | Status: AC
  Administered 2019-10-01: 50 ug via INTRAVENOUS
  Filled 2019-10-01: qty 2

## 2019-10-01 MED ORDER — STERILE WATER FOR IRRIGATION IR SOLN
Status: DC | PRN
  Administered 2019-10-01: 2000 mL

## 2019-10-01 MED ORDER — SODIUM CHLORIDE (PF) 0.9 % IJ SOLN
INTRAMUSCULAR | Status: AC
  Filled 2019-10-01: qty 10

## 2019-10-01 MED ORDER — OXYCODONE HCL 5 MG PO TABS: 5.0000 mg | ORAL_TABLET | ORAL | 0 refills | Status: AC | PRN

## 2019-10-01 MED ORDER — BUPIVACAINE IN DEXTROSE 0.75-8.25 % IT SOLN
INTRATHECAL | Status: DC | PRN
  Administered 2019-10-01: 2 mL via INTRATHECAL

## 2019-10-01 MED ORDER — METHOCARBAMOL 500 MG IVPB - SIMPLE MED
500.0000 mg | Freq: Four times a day (QID) | INTRAVENOUS | Status: DC | PRN
  Filled 2019-10-01: qty 50

## 2019-10-01 MED ORDER — MONTELUKAST SODIUM 10 MG PO TABS
10.0000 mg | ORAL_TABLET | Freq: Every day | ORAL | Status: DC
  Administered 2019-10-02: 10 mg via ORAL
  Filled 2019-10-01: qty 1

## 2019-10-01 MED ORDER — PROPOFOL 500 MG/50ML IV EMUL
INTRAVENOUS | Status: DC | PRN
  Administered 2019-10-01: 50 ug/kg/min via INTRAVENOUS

## 2019-10-01 MED ORDER — IRRISEPT - 450ML BOTTLE WITH 0.05% CHG IN STERILE WATER, USP 99.95% OPTIME
TOPICAL | Status: DC | PRN
  Administered 2019-10-01: 13:00:00 250 mL

## 2019-10-01 MED ORDER — SODIUM CHLORIDE 0.9 % IV SOLN: INTRAVENOUS | Status: DC

## 2019-10-01 MED ORDER — SODIUM CHLORIDE (PF) 0.9 % IJ SOLN
INTRAMUSCULAR | Status: AC
  Filled 2019-10-01: qty 50

## 2019-10-01 MED ORDER — ACETAMINOPHEN 10 MG/ML IV SOLN: 1000.0000 mg | Freq: Once | INTRAVENOUS | Status: DC | PRN

## 2019-10-01 MED ORDER — BUPIVACAINE LIPOSOME 1.3 % IJ SUSP
INTRAMUSCULAR | Status: DC | PRN
  Administered 2019-10-01: 20 mL

## 2019-10-01 MED ORDER — ACETAMINOPHEN 325 MG PO TABS: 325.0000 mg | ORAL_TABLET | Freq: Once | ORAL | Status: DC | PRN

## 2019-10-01 MED ORDER — PANTOPRAZOLE SODIUM 40 MG PO TBEC
40.0000 mg | DELAYED_RELEASE_TABLET | Freq: Every day | ORAL | Status: DC
  Administered 2019-10-02: 40 mg via ORAL
  Filled 2019-10-01: qty 1

## 2019-10-01 MED ORDER — ONDANSETRON HCL 4 MG/2ML IJ SOLN
INTRAMUSCULAR | Status: AC
  Filled 2019-10-01: qty 2

## 2019-10-01 MED ORDER — SENNOSIDES-DOCUSATE SODIUM 8.6-50 MG PO TABS: 1.0000 | ORAL_TABLET | Freq: Every evening | ORAL | Status: DC | PRN

## 2019-10-01 MED ORDER — METHOCARBAMOL 500 MG PO TABS: 500.0000 mg | ORAL_TABLET | Freq: Four times a day (QID) | ORAL | 0 refills | Status: DC

## 2019-10-01 MED ORDER — OXYCODONE HCL 5 MG PO TABS
5.0000 mg | ORAL_TABLET | ORAL | Status: DC | PRN
  Administered 2019-10-02: 10 mg via ORAL
  Filled 2019-10-01 (×2): qty 2

## 2019-10-01 MED ORDER — CHLORHEXIDINE GLUCONATE 4 % EX LIQD
60.0000 mL | Freq: Once | CUTANEOUS | Status: AC
  Administered 2019-10-01: 4 via TOPICAL

## 2019-10-01 MED ORDER — MIDAZOLAM HCL 5 MG/5ML IJ SOLN
INTRAMUSCULAR | Status: DC | PRN
  Administered 2019-10-01 (×2): 1 mg via INTRAVENOUS

## 2019-10-01 MED ORDER — MAGNESIUM CITRATE PO SOLN: 1.0000 | Freq: Once | ORAL | Status: DC | PRN

## 2019-10-01 MED ORDER — ONDANSETRON HCL 4 MG PO TABS: 4.0000 mg | ORAL_TABLET | Freq: Four times a day (QID) | ORAL | Status: DC | PRN

## 2019-10-01 MED ORDER — TRANEXAMIC ACID-NACL 1000-0.7 MG/100ML-% IV SOLN
1000.0000 mg | INTRAVENOUS | Status: AC
  Administered 2019-10-01: 1000 mg via INTRAVENOUS
  Filled 2019-10-01: qty 100

## 2019-10-01 MED ORDER — ROSUVASTATIN CALCIUM 5 MG PO TABS
5.0000 mg | ORAL_TABLET | Freq: Every day | ORAL | Status: DC
  Administered 2019-10-02: 5 mg via ORAL
  Filled 2019-10-01: qty 1

## 2019-10-01 MED ORDER — HYDROMORPHONE HCL 1 MG/ML IJ SOLN
0.5000 mg | INTRAMUSCULAR | Status: DC | PRN
  Filled 2019-10-01: qty 1

## 2019-10-01 MED ORDER — TRAMADOL HCL 50 MG PO TABS
50.0000 mg | ORAL_TABLET | Freq: Four times a day (QID) | ORAL | Status: DC
  Administered 2019-10-01 – 2019-10-02 (×4): 50 mg via ORAL
  Filled 2019-10-01 (×4): qty 1

## 2019-10-01 MED ORDER — SODIUM CHLORIDE (PF) 0.9 % IJ SOLN
INTRAMUSCULAR | Status: DC | PRN
  Administered 2019-10-01: 60 mL

## 2019-10-01 MED ORDER — ONDANSETRON HCL 4 MG/2ML IJ SOLN
4.0000 mg | Freq: Four times a day (QID) | INTRAMUSCULAR | Status: DC | PRN
  Filled 2019-10-01: qty 2

## 2019-10-01 SURGICAL SUPPLY — 70 items
ADH SKN CLS APL DERMABOND .7 (GAUZE/BANDAGES/DRESSINGS) ×1
ATTUNE MED DOME PAT 38 KNEE (Knees) ×1 IMPLANT
ATTUNE PS FEM LT SZ 6 CEM KNEE (Femur) ×1 IMPLANT
ATTUNE PSRP INSR SZ6 7 KNEE (Insert) ×1 IMPLANT
BAG DECANTER FOR FLEXI CONT (MISCELLANEOUS) IMPLANT
BAG SPEC THK2 15X12 ZIP CLS (MISCELLANEOUS) ×2
BAG ZIPLOCK 12X15 (MISCELLANEOUS) ×4 IMPLANT
BASE TIBIAL ROT PLAT SZ 7 KNEE (Knees) IMPLANT
BLADE SAG 18X100X1.27 (BLADE) ×2 IMPLANT
BLADE SAW SGTL 11.0X1.19X90.0M (BLADE) ×2 IMPLANT
BNDG ELASTIC 4X5.8 VLCR STR LF (GAUZE/BANDAGES/DRESSINGS) ×2 IMPLANT
BNDG ELASTIC 6X5.8 VLCR STR LF (GAUZE/BANDAGES/DRESSINGS) ×2 IMPLANT
BOWL SMART MIX CTS (DISPOSABLE) ×2 IMPLANT
BSPLAT TIB 7 CMNT ROT PLAT STR (Knees) ×1 IMPLANT
CEMENT HV SMART SET (Cement) ×2 IMPLANT
COVER SURGICAL LIGHT HANDLE (MISCELLANEOUS) ×2 IMPLANT
COVER WAND RF STERILE (DRAPES) ×1 IMPLANT
CUFF TOURN SGL QUICK 34 (TOURNIQUET CUFF) ×2
CUFF TRNQT CYL 34X4.125X (TOURNIQUET CUFF) ×1 IMPLANT
DECANTER SPIKE VIAL GLASS SM (MISCELLANEOUS) ×2 IMPLANT
DERMABOND ADVANCED (GAUZE/BANDAGES/DRESSINGS) ×1
DERMABOND ADVANCED .7 DNX12 (GAUZE/BANDAGES/DRESSINGS) ×1 IMPLANT
DRAPE U-SHAPE 47X51 STRL (DRAPES) ×2 IMPLANT
DRSG AQUACEL AG ADV 3.5X10 (GAUZE/BANDAGES/DRESSINGS) ×2 IMPLANT
DRSG TEGADERM 4X4.75 (GAUZE/BANDAGES/DRESSINGS) ×2 IMPLANT
DURAPREP 26ML APPLICATOR (WOUND CARE) ×4 IMPLANT
ELECT REM PT RETURN 15FT ADLT (MISCELLANEOUS) ×2 IMPLANT
EVACUATOR 1/8 PVC DRAIN (DRAIN) ×2 IMPLANT
GAUZE SPONGE 2X2 8PLY STRL LF (GAUZE/BANDAGES/DRESSINGS) ×1 IMPLANT
GLOVE BIOGEL PI IND STRL 7.5 (GLOVE) ×1 IMPLANT
GLOVE BIOGEL PI IND STRL 8 (GLOVE) ×1 IMPLANT
GLOVE BIOGEL PI INDICATOR 7.5 (GLOVE) ×1
GLOVE BIOGEL PI INDICATOR 8 (GLOVE) ×1
GLOVE ECLIPSE 8.0 STRL XLNG CF (GLOVE) ×2 IMPLANT
GLOVE SURG ORTHO 9.0 STRL STRW (GLOVE) ×2 IMPLANT
GLOVE SURG SS PI 7.0 STRL IVOR (GLOVE) ×2 IMPLANT
GOWN STRL REUS W/TWL XL LVL3 (GOWN DISPOSABLE) ×4 IMPLANT
HANDPIECE INTERPULSE COAX TIP (DISPOSABLE) ×2
HOLDER FOLEY CATH W/STRAP (MISCELLANEOUS) ×1 IMPLANT
JET LAVAGE IRRISEPT WOUND (IRRIGATION / IRRIGATOR) ×2
KIT TURNOVER KIT A (KITS) IMPLANT
LAVAGE JET IRRISEPT WOUND (IRRIGATION / IRRIGATOR) ×1 IMPLANT
NS IRRIG 1000ML POUR BTL (IV SOLUTION) ×2 IMPLANT
PACK TOTAL KNEE CUSTOM (KITS) ×2 IMPLANT
PENCIL SMOKE EVACUATOR (MISCELLANEOUS) ×1 IMPLANT
PIN STEINMAN FIXATION KNEE (PIN) ×1 IMPLANT
PROTECTOR NERVE ULNAR (MISCELLANEOUS) ×2 IMPLANT
SET HNDPC FAN SPRY TIP SCT (DISPOSABLE) ×1 IMPLANT
SET PAD KNEE POSITIONER (MISCELLANEOUS) ×2 IMPLANT
SPONGE GAUZE 2X2 STER 10/PKG (GAUZE/BANDAGES/DRESSINGS) ×1
SPONGE LAP 18X18 RF (DISPOSABLE) IMPLANT
SPONGE SURGIFOAM ABS GEL 100 (HEMOSTASIS) ×2 IMPLANT
STOCKINETTE 6  STRL (DRAPES) ×1
STOCKINETTE 6 STRL (DRAPES) ×1 IMPLANT
SUCTION FRAZIER HANDLE 12FR (TUBING) ×1
SUCTION TUBE FRAZIER 12FR DISP (TUBING) IMPLANT
SUT BONE WAX W31G (SUTURE) IMPLANT
SUT MNCRL AB 3-0 PS2 18 (SUTURE) ×2 IMPLANT
SUT VIC AB 1 CT1 27 (SUTURE) ×6
SUT VIC AB 1 CT1 27XBRD ANTBC (SUTURE) ×3 IMPLANT
SUT VIC AB 2-0 CT1 27 (SUTURE) ×4
SUT VIC AB 2-0 CT1 TAPERPNT 27 (SUTURE) ×2 IMPLANT
SUT VLOC 180 0 24IN GS25 (SUTURE) ×2 IMPLANT
SYR 50ML LL SCALE MARK (SYRINGE) IMPLANT
TAPE STRIPS DRAPE STRL (GAUZE/BANDAGES/DRESSINGS) ×2 IMPLANT
TIBIAL BASE ROT PLAT SZ 7 KNEE (Knees) ×2 IMPLANT
TRAY FOLEY MTR SLVR 16FR STAT (SET/KITS/TRAYS/PACK) ×2 IMPLANT
WATER STERILE IRR 1000ML POUR (IV SOLUTION) ×4 IMPLANT
WRAP KNEE MAXI GEL POST OP (GAUZE/BANDAGES/DRESSINGS) ×2 IMPLANT
YANKAUER SUCT BULB TIP 10FT TU (MISCELLANEOUS) ×3 IMPLANT

## 2019-10-01 NOTE — Transfer of Care (Signed)
Immediate Anesthesia Transfer of Care Note  Patient: Joe [name]  Procedure(s) Performed: TOTAL KNEE ARTHROPLASTY (Left Knee)  Patient Location: PACU  Anesthesia Type:Spinal  Level of Consciousness: awake, alert , oriented and patient cooperative  Airway & Oxygen Therapy: Patient Spontanous Breathing  Post-op Assessment: Report given to RN and Post -op Vital signs reviewed and stable  Post vital signs: Reviewed and stable  Last Vitals:  Vitals Value Taken Time  BP 92/65 10/01/19 1252  Temp    Pulse 72 10/01/19 1254  Resp 15 10/01/19 1254  SpO2 97 % 10/01/19 1254  Vitals shown include unvalidated device data.  Last Pain:  Vitals:   10/01/19 1030  TempSrc:   PainSc: 0-No pain      Patients Stated Pain Goal: 3 (10/01/19 0900)  Complications: No apparent anesthesia complications

## 2019-10-01 NOTE — Op Note (Signed)
DATE OF SURGERY:  10/01/2019  TIME: 12:36 PM  PATIENT NAME:  Joe [name]    AGE: 59 y.o.   PRE-OPERATIVE DIAGNOSIS:  Left knee osteoarthritis  POST-OPERATIVE DIAGNOSIS:  Left knee osteoarthritis  PROCEDURE:  Procedure(s): TOTAL KNEE ARTHROPLASTY  SURGEON:  Dynasia Kercheval ANDREW  ASSISTANT:  Joe Haus, PA-C, present and scrubbed throughout the case, critical for assistance with exposure, retraction, instrumentation, and closure.  OPERATIVE IMPLANTS: Depuy PFC Attune Rotating Platform.  Femur size 6, Tibia size 7, Patella size 38 3-peg oval button, with a 7 mm polyethylene insert.   PREOPERATIVE INDICATIONS:   Joe [name] is a 59 y.o. year old male with end stage bone on bone arthritis of the knee who failed conservative treatment and elected for Total Knee Arthroplasty.   The risks, benefits, and alternatives were discussed at length including but not limited to the risks of infection, bleeding, nerve injury, stiffness, blood clots, the need for revision surgery, cardiopulmonary complications, among others, and they were willing to proceed.  OPERATIVE DESCRIPTION:  The patient was brought to the operative room and placed in a supine position.  Spinal anesthesia was administered.  IV antibiotics were given.  The lower extremity was prepped and draped in the usual sterile fashion.  Time out was performed.  The leg was elevated and exsanguinated and the tourniquet was inflated.  Anterior quadriceps tendon splitting approach was performed.  The patella was retracted and osteophytes were removed.  The anterior horn of the medial and lateral meniscus was removed and cruciate ligaments resected.   The distal femur was opened with the drill and the intramedullary distal femoral cutting jig was utilized, set at 5 degrees resecting 10 mm off the distal femur.  Care was taken to protect the collateral ligaments.  The distal femoral sizing jig was applied, taking care to avoid  notching.  Then the 4-in-1 cutting jig was applied and the anterior and posterior femur was cut, along with the chamfer cuts.    Then the extramedullary tibial cutting jig was utilized making the appropriate cut using the anterior tibial crest as a reference building in appropriate posterior slope.  Care was taken during the cut to protect the medial and collateral ligaments.  The proximal tibia was removed along with the posterior horns of the menisci.   The posterior medial femoral osteophytes and posterior lateral femoral osteophytes were removed.    The flexion gap was then measured and was symmetric with the extension gap, measured at 7.  I completed the distal femoral preparation using the appropriate jig to prepare the box.  The patella was then measured, and cut with the saw.    The proximal tibia sized and prepared accordingly with the reamer and the punch, and then all components were trialed with the trial insert.  The knee was found to have excellent balance and full motion.    The above named components were then cemented into place and all excess cement was removed.  The trial polyethylene component was in place during cementation, and then was exchanged for the real polyethylene component.    The knee was easily taken through a range of motion and the patella tracked well and the knee irrigated copiously and the parapatellar and subcutaneous tissue closed with vicryl, and monocryl with steri strips for the skin.  The arthrotomy was closed at 90 of flexion. The wounds were dressed with sterile gauze and the tourniquet released and the patient was awakened and returned to the PACU in  stable and satisfactory condition.  There were no complications.  Total tourniquet time was 85 minutes.

## 2019-10-01 NOTE — Anesthesia Procedure Notes (Signed)
Anesthesia Regional Block: Adductor canal block   Pre-Anesthetic Checklist: ,, timeout performed, Correct Patient, Correct Site, Correct Laterality, Correct Procedure, Correct Position, site marked, Risks and benefits discussed,  Surgical consent,  Pre-op evaluation,  At surgeon's request and post-op pain management  Laterality: Left  Prep: chloraprep       Needles:  Injection technique: Single-shot  Needle Type: Echogenic Stimulator Needle     Needle Length: 9cm  Needle Gauge: 21     Additional Needles:   Procedures:,,,, ultrasound used (permanent image in chart),,,,  Narrative:  Start time: 10/01/2019 9:45 AM End time: 10/01/2019 10:03 AM Injection made incrementally with aspirations every 5 mL.  Performed by: Personally  Anesthesiologist: Shelton Silvas, MD  Additional Notes: Patient tolerated the procedure well. Local anesthetic introduced in an incremental fashion under minimal resistance after negative aspirations. No paresthesias were elicited. After completion of the procedure, no acute issues were identified and patient continued to be monitored by RN.

## 2019-10-01 NOTE — Anesthesia Procedure Notes (Signed)
Spinal  Patient location during procedure: OR Start time: 10/01/2019 10:44 AM End time: 10/01/2019 10:49 AM Staffing Performed: resident/CRNA  Resident/CRNA: Garth Bigness, CRNA Preanesthetic Checklist Completed: patient identified, IV checked, site marked, risks and benefits discussed, surgical consent, monitors and equipment checked, pre-op evaluation and timeout performed Spinal Block Patient position: sitting Prep: DuraPrep Patient monitoring: heart rate, cardiac monitor, continuous pulse ox and blood pressure Approach: midline Location: L3-4 Injection technique: single-shot Needle Needle type: Pencan  Needle gauge: 24 G Needle length: 9 cm Needle insertion depth: 5 cm Assessment Sensory level: T4

## 2019-10-01 NOTE — Anesthesia Preprocedure Evaluation (Addendum)
Anesthesia Evaluation  Patient identified by MRN, date of birth, ID band Patient awake    Reviewed: Allergy & Precautions, NPO status , Patient's Chart, lab work & pertinent test results  Airway Mallampati: I  TM Distance: >3 FB Neck ROM: Full    Dental  (+) Teeth Intact, Dental Advisory Given   Pulmonary asthma , Current Smoker and Patient abstained from smoking.,    breath sounds clear to auscultation       Cardiovascular negative cardio ROS   Rhythm:Regular Rate:Normal     Neuro/Psych negative neurological ROS  negative psych ROS   GI/Hepatic Neg liver ROS, GERD  Medicated,  Endo/Other  negative endocrine ROS  Renal/GU negative Renal ROS     Musculoskeletal  (+) Arthritis ,   Abdominal Normal abdominal exam  (+)   Peds  Hematology negative hematology ROS (+)   Anesthesia Other Findings   Reproductive/Obstetrics                            Anesthesia Physical Anesthesia Plan  ASA: II  Anesthesia Plan: Spinal   Post-op Pain Management:  Regional for Post-op pain   Induction: Intravenous  PONV Risk Score and Plan: Propofol infusion, Ondansetron and Midazolam  Airway Management Planned: Natural Airway and Simple Face Mask  Additional Equipment: None  Intra-op Plan:   Post-operative Plan:   Informed Consent: I have reviewed the patients History and Physical, chart, labs and discussed the procedure including the risks, benefits and alternatives for the proposed anesthesia with the patient or authorized representative who has indicated his/her understanding and acceptance.       Plan Discussed with: CRNA  Anesthesia Plan Comments: (Lab Results      Component                Value               Date                      WBC                      7.2                 09/28/2019                HGB                      16.3                09/28/2019                HCT                       48.6                09/28/2019                MCV                      90.2                09/28/2019                PLT                      204  09/28/2019            Covid-19 Nucleic Acid Test Results Lab Results      Component                Value               Date                      [id number]              NEGATIVE            09/28/2019           )       Anesthesia Quick Evaluation

## 2019-10-01 NOTE — Evaluation (Signed)
Physical Therapy Evaluation Patient Details Name: Joe [name] MRN: [id number] DOB: Dec 03, 1960 Today's Date: 10/01/2019   History of Present Illness  Patient is 59 y.o. male s/p Lt TKA with PMH significant for asthma and OA.   Clinical Impression  Joe [name] is a 59 y.o. male POD 0 s/p Lt TKA. Patient reports independence with mobility at baseline. Patient is now limited by functional impairments (see PT problem list below) and requires min assist for transfers and gait with RW. Patient was able to ambulate ~35 feet with RW and min assist with manual facilitation of Lt knee extensionto prevent buckling. Patient instructed in exercise to facilitate ROM and circulation. Patient will benefit from continued skilled PT interventions to address impairments and progress towards PLOF. Acute PT will follow to progress mobility and stair training in preparation for safe discharge home.     Follow Up Recommendations Follow surgeon's recommendation for DC plan and follow-up therapies    Equipment Recommendations  Rolling walker with 5" wheels    Recommendations for Other Services       Precautions / Restrictions Precautions Precautions: Fall Restrictions Weight Bearing Restrictions: No      Mobility  Bed Mobility Overal bed mobility: Needs Assistance Bed Mobility: Supine to Sit     Supine to sit: HOB elevated;Min guard     General bed mobility comments: cues for use of bed rail and assist for line management  Transfers Overall transfer level: Needs assistance Equipment used: Rolling walker (2 wheeled) Transfers: Sit to/from Stand Sit to Stand: Min assist         General transfer comment: cues required for technique with RW and safe hand placement. pt requried assist for power up and to steady with rising. He was unsteadywith poor hip control on standing initially but was able to regain balance and step feet apart for wider BOS with verbal  cues.  Ambulation/Gait Ambulation/Gait assistance: Min assist Gait Distance (Feet): 35 Feet Assistive device: Rolling walker (2 wheeled) Gait Pattern/deviations: Step-to pattern;Decreased weight shift to left;Decreased stance time - left;Decreased stride length;Decreased step length - right Gait velocity: decreased   General Gait Details: cues for safe step pattern and proximity to RW, assist to facilitate knee extension on Lt LE during stance phase and cues for patient to unweight his Lt LE to prevent buckling.  Stairs            Wheelchair Mobility    Modified Rankin (Stroke Patients Only)       Balance Overall balance assessment: Needs assistance Sitting-balance support: Feet supported Sitting balance-Leahy Scale: Good     Standing balance support: Bilateral upper extremity supported;During functional activity Standing balance-Leahy Scale: Poor          Pertinent Vitals/Pain Pain Assessment: 0-10 Pain Score: 6  Pain Location: Lt knee Pain Descriptors / Indicators: Burning Pain Intervention(s): Limited activity within patient's tolerance;Monitored during session;Repositioned    Home Living Family/patient expects to be discharged to:: Private residence Living Arrangements: Spouse/significant other Available Help at Discharge: Family Type of Home: House Home Access: Stairs to enter Entrance Stairs-Rails: Doctor, general practice of Steps: 3 Home Layout: Two level;Bed/bath upstairs;1/2 bath on main level Home Equipment: Cane - single point;Shower seat;Crutches      Prior Function Level of Independence: Independent         Comments: 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     Hand Dominance   Dominant Hand: Right    Extremity/Trunk Assessment   Upper Extremity Assessment Upper Extremity  Assessment: Overall WFL for tasks assessed    Lower Extremity Assessment Lower Extremity Assessment: Overall WFL for tasks assessed;LLE  deficits/detail LLE Deficits / Details: pt with good quad activation, no extensor lag noted with SLR, pt Lt LE buckling some in standing LLE Sensation: WNL LLE Coordination: WNL    Cervical / Trunk Assessment Cervical / Trunk Assessment: Normal  Communication   Communication: No difficulties  Cognition Arousal/Alertness: Awake/alert Behavior During Therapy: WFL for tasks assessed/performed Overall Cognitive Status: Within Functional Limits for tasks assessed         General Comments      Exercises Total Joint Exercises Ankle Circles/Pumps: AROM;Both;Seated;10 reps Quad Sets: AROM;Seated;Left;5 reps Heel Slides: AROM;Seated;Left;5 reps   Assessment/Plan    PT Assessment Patient needs continued PT services  PT Problem List Decreased strength;Decreased mobility;Decreased range of motion;Decreased activity tolerance;Decreased balance;Decreased knowledge of use of DME       PT Treatment Interventions DME instruction;Therapeutic exercise;Balance training;Gait training;Stair training;Functional mobility training;Therapeutic activities;Patient/family education    PT Goals (Current goals can be found in the Care Plan section)  Acute Rehab PT Goals Patient Stated Goal: back to work at end of March PT Goal Formulation: With patient Time For Goal Achievement: 10/08/19 Potential to Achieve Goals: Good    Frequency 7X/week    AM-PAC PT "6 Clicks" Mobility  Outcome Measure Help needed turning from your back to your side while in a flat bed without using bedrails?: None Help needed moving from lying on your back to sitting on the side of a flat bed without using bedrails?: None Help needed moving to and from a bed to a chair (including a wheelchair)?: A Little Help needed standing up from a chair using your arms (e.g., wheelchair or bedside chair)?: A Little Help needed to walk in hospital room?: A Little Help needed climbing 3-5 steps with a railing? : A Little 6 Click Score:  20    End of Session Equipment Utilized During Treatment: Gait belt Activity Tolerance: Patient tolerated treatment well Patient left: in chair;with chair alarm set;with family/visitor present;with call bell/phone within reach Nurse Communication: Mobility status PT Visit Diagnosis: Muscle weakness (generalized) (M62.81);Difficulty in walking, not elsewhere classified (R26.2);Unsteadiness on feet (R26.81)    Time: 5038-8828 PT Time Calculation (min) (ACUTE ONLY): 23 min   Charges:   PT Evaluation $PT Eval Low Complexity: 1 Low PT Treatments $Therapeutic Exercise: 8-22 mins        Verner Mould, DPT Physical Therapist with [facility] [phone]  10/01/2019 3:41 PM

## 2019-10-01 NOTE — Interval H&P Note (Signed)
History and Physical Interval Note:  10/01/2019 9:50 AM  Joe [name]  has presented today for surgery, with the diagnosis of Left knee osteoarthritis.  The various methods of treatment have been discussed with the patient and family. After consideration of risks, benefits and other options for treatment, the patient has consented to  Procedure(s) with comments: TOTAL KNEE ARTHROPLASTY (Left) - with adductor canal as a surgical intervention.  The patient's history has been reviewed, patient examined, no change in status, stable for surgery.  I have reviewed the patient's chart and labs.  Questions were answered to the patient's satisfaction.     [name] ANDREW

## 2019-10-01 NOTE — Plan of Care (Signed)
  Problem: Education: Goal: Knowledge of the prescribed therapeutic regimen will improve Outcome: Progressing   Problem: Activity: Goal: Ability to avoid complications of mobility impairment will improve Outcome: Progressing Goal: Range of joint motion will improve Outcome: Progressing   Problem: Clinical Measurements: Goal: Postoperative complications will be avoided or minimized Outcome: Progressing   Problem: Pain Management: Goal: Pain level will decrease with appropriate interventions Outcome: Progressing   

## 2019-10-01 NOTE — Interval H&P Note (Signed)
History and Physical Interval Note:  10/01/2019 9:48 AM  Joe [name]  has presented today for surgery, with the diagnosis of Left knee osteoarthritis.  The various methods of treatment have been discussed with the patient and family. After consideration of risks, benefits and other options for treatment, the patient has consented to  Procedure(s) with comments: TOTAL KNEE ARTHROPLASTY (Left) - with adductor canal as a surgical intervention.  The patient's history has been reviewed, patient examined, no change in status, stable for surgery.  I have reviewed the patient's chart and labs.  Questions were answered to the patient's satisfaction.     Joe [name] ANDREW

## 2019-10-02 DIAGNOSIS — M1712 Unilateral primary osteoarthritis, left knee: Secondary | ICD-10-CM | POA: Diagnosis not present

## 2019-10-02 NOTE — Progress Notes (Signed)
Physical Therapy Treatment Patient Details Name: Joe [name] MRN: [id number] DOB: 06-05-1961 Today's Date: 10/02/2019    History of Present Illness Patient is 59 y.o. male s/p Lt TKA with PMH significant for asthma and OA.    PT Comments    Pt progressing well. Will see for a second session to review HEP, should be ready for d/c at that time.    Follow Up Recommendations  Follow surgeon's recommendation for DC plan and follow-up therapies     Equipment Recommendations  Rolling walker with 5" wheels    Recommendations for Other Services       Precautions / Restrictions Precautions Precautions: Fall;Knee Restrictions Other Position/Activity Restrictions: WBAT    Mobility  Bed Mobility Overal bed mobility: Needs Assistance Bed Mobility: Supine to Sit     Supine to sit: Supervision     General bed mobility comments: for safety   Transfers Overall transfer level: Needs assistance Equipment used: Rolling walker (2 wheeled) Transfers: Sit to/from Stand Sit to Stand: Supervision         General transfer comment: cues for hand placement  Ambulation/Gait Ambulation/Gait assistance: Min guard Gait Distance (Feet): 170 Feet Assistive device: Rolling walker (2 wheeled) Gait Pattern/deviations: Decreased weight shift to left;Decreased stance time - left;Decreased stride length;Step-through pattern Gait velocity: decreased   General Gait Details: cues for sequence and gait progression, RW  position   Stairs Stairs: Yes Stairs assistance: Min guard Stair Management: Two rails;Step to pattern;Forwards Number of Stairs: 3 General stair comments: cues for sequence; verbally reviewed up/down stairs with one hand rail  for going to second level    Wheelchair Mobility    Modified Rankin (Stroke Patients Only)       Balance                                            Cognition Arousal/Alertness: Awake/alert Behavior During Therapy: WFL  for tasks assessed/performed Overall Cognitive Status: Within Functional Limits for tasks assessed                                        Exercises Total Joint Exercises Ankle Circles/Pumps: AROM;Both;10 reps    General Comments        Pertinent Vitals/Pain Pain Assessment: 0-10 Pain Score: 4  Pain Location: Lt knee Pain Descriptors / Indicators: Burning Pain Intervention(s): Limited activity within patient's tolerance;Monitored during session;Premedicated before session;Ice applied    Home Living                      Prior Function            PT Goals (current goals can now be found in the care plan section) Acute Rehab PT Goals Patient Stated Goal: back to work at end of March PT Goal Formulation: With patient Time For Goal Achievement: 10/08/19 Potential to Achieve Goals: Good Progress towards PT goals: Progressing toward goals    Frequency    7X/week      PT Plan Current plan remains appropriate    Co-evaluation              AM-PAC PT "6 Clicks" Mobility   Outcome Measure  Help needed turning from your back to your side while in a flat bed without using bedrails?:  None Help needed moving from lying on your back to sitting on the side of a flat bed without using bedrails?: None Help needed moving to and from a bed to a chair (including a wheelchair)?: A Little Help needed standing up from a chair using your arms (e.g., wheelchair or bedside chair)?: A Little Help needed to walk in hospital room?: A Little Help needed climbing 3-5 steps with a railing? : A Little 6 Click Score: 20    End of Session Equipment Utilized During Treatment: Gait belt Activity Tolerance: Patient tolerated treatment well Patient left: in chair;with call bell/phone within reach;with chair alarm set Nurse Communication: Mobility status PT Visit Diagnosis: Muscle weakness (generalized) (M62.81);Difficulty in walking, not elsewhere classified  (R26.2);Unsteadiness on feet (R26.81)     Time: 0945-1000 PT Time Calculation (min) (ACUTE ONLY): 15 min  Charges:  $Gait Training: 8-22 mins                     Baxter Flattery, PT   Acute Rehab Dept [facility]): [phone]   10/02/2019    [facility] 10/02/2019, 10:58 AM

## 2019-10-02 NOTE — TOC Progression Note (Signed)
Transition of Care [facility]) - Progression Note    Patient Details  Name: Joe [name] MRN: [id number] Date of Birth: [birth year]  [facility] CM/SW Contact  Armanda Heritage, RN Phone Number: 10/02/2019, 9:26 AM  Clinical Narrative:    CM spoke with patient who reports he is planning to go home with outpatient PT. Patient has an appointment set up for Tuesday.  Patient reports he has made arrangements to borrow the equipment he needs (rolling walker and 3-in-1).  CM offered to set up equipment for patient so he would have his own but patient declined.     Expected Discharge Plan: Home/Self Care Barriers to Discharge: No Barriers Identified  Expected Discharge Plan and Services Expected Discharge Plan: Home/Self Care   Discharge Planning Services: CM Consult   Living arrangements for the past 2 months: Single Family Home Expected Discharge Date: 10/02/19               DME Arranged: N/A DME Agency: NA       HH Arranged: NA HH Agency: NA         Social Determinants of Health (SDOH) Interventions    Readmission Risk Interventions No flowsheet data found.

## 2019-10-02 NOTE — Progress Notes (Signed)
Assessment unchanged. Pt and wife verbalized understanding of dc instructions through teach back. Discharged via wc to front entrance accompanied by wife and NT.   

## 2019-10-02 NOTE — Progress Notes (Signed)
Subjective: 1 Day Post-Op Procedure(s) (LRB): TOTAL KNEE ARTHROPLASTY (Left) Patient reports pain as mild.   No events over night. Tolerating PO without N/V +ambulation with PT last night +flatus Due to void   Objective: Vital signs in last 24 hours: Temp:  [97.5 F (36.4 C)-97.9 F (36.6 C)] 97.5 F (36.4 C) (02/20 0539) Pulse Rate:  [62-85] 84 (02/20 0539) Resp:  [13-18] 16 (02/20 0539) BP: (92-141)/(59-95) 125/84 (02/20 0539) SpO2:  [95 %-100 %] 96 % (02/20 0539)  Intake/Output from previous day: 02/19 0701 - 02/20 0700 In: 2921.4 [P.O.:600; I.V.:2321.4] Out: 2050 [Urine:1325; Drains:695; Blood:30] Intake/Output this shift: No intake/output data recorded.  Recent Labs    10/01/19 1319  HGB 15.5   Recent Labs    10/01/19 1319  WBC 6.3  RBC 5.07  HCT 46.0  PLT 193   Recent Labs    10/01/19 1319  CREATININE 1.14   No results for input(s): LABPT, INR in the last 72 hours.  Neurologically intact ABD soft Neurovascular intact Sensation intact distally Intact pulses distally Dorsiflexion/Plantar flexion intact Incision: dressing C/D/I No cellulitis present Compartment soft  Calfs non-tender, no palpable cords, homan's negative bilaterally.  Drain with minimal serosanguinous output. Pulled without difficulty.  Assessment/Plan: 1 Day Post-Op Procedure(s) (LRB): TOTAL KNEE ARTHROPLASTY (Left) Advance diet Up with therapy  Drain pulled D/C foley Encourage IS DVT PPx: Teds, SCDs, ambulation, on lovenox  Plan discharge today after he works with PTas long as patient is able to void on his own.  Joe [name] Joe [name] 10/02/2019, 8:55 AM

## 2019-10-04 ENCOUNTER — Encounter: Payer: Self-pay | Admitting: *Deleted

## 2019-10-04 NOTE — Anesthesia Postprocedure Evaluation (Signed)
Anesthesia Post Note  Patient: Joe [name]  Procedure(s) Performed: TOTAL KNEE ARTHROPLASTY (Left Knee)     Patient location during evaluation: PACU Anesthesia Type: Spinal Level of consciousness: oriented and awake and alert Pain management: pain level controlled Vital Signs Assessment: post-procedure vital signs reviewed and stable Respiratory status: spontaneous breathing, respiratory function stable and patient connected to nasal cannula oxygen Cardiovascular status: blood pressure returned to baseline and stable Postop Assessment: no headache, no backache and no apparent nausea or vomiting Anesthetic complications: no    Last Vitals:  Vitals:   10/02/19 0222 10/02/19 0539  BP: 111/71 125/84  Pulse: 85 84  Resp: 16 16  Temp: 36.6 C (!) 36.4 C  SpO2: 95% 96%    Last Pain:  Vitals:   10/02/19 0810  TempSrc:   PainSc: 0-No pain                 Shelton Silvas

## 2019-10-11 NOTE — Discharge Summary (Signed)
Physician Discharge Summary  Patient ID: Joe [name] MRN: [id number] DOB/AGE: Jul 24, 1961 59 y.o.  Admit date: 10/01/2019 Discharge date: 10/11/2019  Admission Diagnoses: Left knee Osteoarthritis  Discharge Diagnoses:  Active Problems:   Osteoarthritis of left knee   Discharged Condition: stable  Hospital Course: Patient was admitted on February 19 for a left total knee arthroplasty due to end-stage left knee osteoarthritis.  Patient did well during surgery and was sent to PACU in stable condition.  He was sent up to the postop floor in stable condition.  He was evaluated by physical therapy that night was able to get up and move.  Pain was well controlled.  No events overnight. Postop day 1:patient was doing well, able to get up with physical therapy .  Patient was discharged on postop day 1 with pain medication, muscle relaxer as well as nausea medication.  He had antibiotics while in the hospital.  Consults: Physical therapy  Significant Diagnostic Studies: none  Treatments: IV hydration, antibiotics: Ancef, analgesia: acetaminophen w/ codeine, anticoagulation: Lovenox, therapies: PT and surgery: Left TKA  Discharge Exam: Blood pressure 125/84, pulse 84, temperature (!) 97.5 F (36.4 C), temperature source Oral, resp. rate 16, height 5\' 9"  (1.753 m), weight 99.1 kg, SpO2 96 %. General appearance: alert, cooperative, appears stated age and no distress Extremities: extremities normal, atraumatic, no cyanosis or edema Pulses: 2+ and symmetric Skin: Skin color, texture, turgor normal. No rashes or lesions Neurologic: Alert and oriented X 3, normal strength and tone. Normal symmetric reflexes. Normal coordination and gait Incision/Wound: Dressings clean dry and intact  Disposition: Discharge disposition: 01-Home or Self Care       Discharge Instructions    Call MD / Call 911   Complete by: As directed    If you experience chest pain or shortness of breath, CALL 911 and be  transported to the hospital emergency room.  If you develope a fever above 101 F, pus (white drainage) or increased drainage or redness at the wound, or calf pain, call your surgeon's office.   Constipation Prevention   Complete by: As directed    Drink plenty of fluids.  Prune juice may be helpful.  You may use a stool softener, such as Colace (over the counter) 100 mg twice a day.  Use MiraLax (over the counter) for constipation as needed.   Diet - low sodium heart healthy   Complete by: As directed    Discharge instructions   Complete by: As directed    [facility] [address], [address] [place], [place] [id number]  TOTAL KNEE REPLACEMENT POSTOPERATIVE DIRECTIONS  Knee Rehabilitation, Guidelines Following Surgery  Results after knee surgery are often greatly improved when you follow the exercise, range of motion and muscle strengthening exercises prescribed by your doctor. Safety measures are also important to protect the knee from further injury. Any time any of these exercises cause you to have increased pain or swelling in your knee joint, decrease the amount until you are comfortable again and slowly increase them. If you have problems or questions, call your caregiver or physical therapist for advice.   HOME CARE INSTRUCTIONS  Remove items at home which could result in a fall. This includes throw rugs or furniture in walking pathways.  ICE to the affected knee every three hours for 30 minutes at a time and then as needed for pain and swelling.  Continue to use ice on the knee for pain and swelling from surgery. You  may notice swelling that will progress down to the foot and ankle.  This is normal after surgery.  Elevate the leg when you are not up walking on it.   Continue to use the breathing machine which will help keep your temperature down.  It is common for your temperature to cycle up and down following surgery, especially at night when you are  not up moving around and exerting yourself.  The breathing machine keeps your lungs expanded and your temperature down. Do not place pillow under knee, focus on keeping the knee straight while resting   DIET You may resume your previous home diet once your are discharged from the hospital.  DRESSING / WOUND CARE / SHOWERING Keep the surgical dressing until follow up.  The dressing is water proof, but you need to put extra covering over it like plastic wrap.  IF THE DRESSING FALLS OFF or the wound gets wet inside, change the dressing with sterile gauze.  Please use good hand washing techniques before changing the dressing.  Do not use any lotions or creams on the incision until instructed by your surgeon.   You may start showering once you are discharged home but do not submerge the incision under water.  You are sent home with an ACE bandage on over the leg, this can be removed at 3 days from surgery. At this time you can start showering. Please place the white TED stocking on the surgical leg after. This needs to be worn on the surgical leg for 2 weeks after surgery.   ACTIVITY Walk with your walker as instructed. Use walker as long as suggested by your caregivers. Avoid periods of inactivity such as sitting longer than an hour when not asleep. This helps prevent blood clots.  You may resume a sexual relationship in one month or when given the OK by your doctor.  You may return to work once you are cleared by your doctor.  Do not drive a car for 6 weeks or until released by you surgeon.  Do not drive while taking narcotics.  WEIGHT BEARING Weight bearing as tolerated with assist device (walker, cane, etc) as directed, use it as long as suggested by your surgeon or therapist, typically at least 4-6 weeks.  POSTOPERATIVE CONSTIPATION PROTOCOL Constipation - defined medically as fewer than three stools per week and severe constipation as less than one stool per week.  One of the most common  issues patients have following surgery is constipation.  Even if you have a regular bowel pattern at home, your normal regimen is likely to be disrupted due to multiple reasons following surgery.  Combination of anesthesia, postoperative narcotics, change in appetite and fluid intake all can affect your bowels.  In order to avoid complications following surgery, here are some recommendations in order to help you during your recovery period.  Colace (docusate) - Pick up an over-the-counter form of Colace or another stool softener and take twice a day as long as you are requiring postoperative pain medications.  Take with a full glass of water daily.  If you experience loose stools or diarrhea, hold the colace until you stool forms back up.  If your symptoms do not get better within 1 week or if they get worse, check with your doctor.  Dulcolax (bisacodyl) - Pick up over-the-counter and take as directed by the product packaging as needed to assist with the movement of your bowels.  Take with a full glass of water.  Use this  product as needed if not relieved by Colace only.   MiraLax (polyethylene glycol) - Pick up over-the-counter to have on hand.  MiraLax is a solution that will increase the amount of water in your bowels to assist with bowel movements.  Take as directed and can mix with a glass of water, juice, soda, coffee, or tea.  Take if you go more than two days without a movement. Do not use MiraLax more than once per day. Call your doctor if you are still constipated or irregular after using this medication for 7 days in a row.  If you continue to have problems with postoperative constipation, please contact the office for further assistance and recommendations.  If you experience "the worst abdominal pain ever" or develop nausea or vomiting, please contact the office immediatly for further recommendations for treatment.  ITCHING  If you experience itching with your medications, try taking only a  single pain pill, or even half a pain pill at a time.  You can also use Benadryl over the counter for itching or also to help with sleep.   TED HOSE STOCKINGS Wear the elastic stockings on both legs for two weeks following surgery during the day but you may remove then at night for sleeping.  Okay to remove ACE in 3 days, put TED on after  MEDICATIONS See your medication summary on the "After Visit Summary" that the nursing staff will review with you prior to discharge.  You may have some home medications which will be placed on hold until you complete the course of blood thinner medication.  It is important for you to complete the blood thinner medication as prescribed by your surgeon.  Continue your approved medications as instructed at time of discharge. If you were not previously taking any blood thinners prior to surgery please start taking Aspirin 325 mg tabs twice daily for 6 weeks. If you are unable to take Aspirin please let your doctor know.   PRECAUTIONS If you experience chest pain or shortness of breath - call 911 immediately for transfer to the hospital emergency department.  If you develop a fever greater that 101 F, purulent drainage from wound, increased redness or drainage from wound, foul odor from the wound/dressing, or calf pain - CONTACT YOUR SURGEON.                                                   FOLLOW-UP APPOINTMENTS Make sure you keep all of your appointments after your operation with your surgeon and caregivers. You should call the office at the above phone number and make an appointment for approximately two weeks after the date of your surgery or on the date instructed by your surgeon outlined in the "After Visit Summary".   RANGE OF MOTION AND STRENGTHENING EXERCISES  Rehabilitation of the knee is important following a knee injury or an operation. After just a few days of immobilization, the muscles of the thigh which control the knee become weakened and shrink  (atrophy). Knee exercises are designed to build up the tone and strength of the thigh muscles and to improve knee motion. Often times heat used for twenty to thirty minutes before working out will loosen up your tissues and help with improving the range of motion but do not use heat for the first two weeks following surgery. These exercises  can be done on a training (exercise) mat, on the floor, on a table or on a bed. Use what ever works the best and is most comfortable for you Knee exercises include:  Leg Lifts - While your knee is still immobilized in a splint or cast, you can do straight leg raises. Lift the leg to 60 degrees, hold for 3 sec, and slowly lower the leg. Repeat 10-20 times 2-3 times daily. Perform this exercise against resistance later as your knee gets better.  Quad and Hamstring Sets - Tighten up the muscle on the front of the thigh (Quad) and hold for 5-10 sec. Repeat this 10-20 times hourly. Hamstring sets are done by pushing the foot backward against an object and holding for 5-10 sec. Repeat as with quad sets.  Leg Slides: Lying on your back, slowly slide your foot toward your buttocks, bending your knee up off the floor (only go as far as is comfortable). Then slowly slide your foot back down until your leg is flat on the floor again. Angel Wings: Lying on your back spread your legs to the side as far apart as you can without causing discomfort.  A rehabilitation program following serious knee injuries can speed recovery and prevent re-injury in the future due to weakened muscles. Contact your doctor or a physical therapist for more information on knee rehabilitation.   IF YOU ARE TRANSFERRED TO A SKILLED REHAB FACILITY If the patient is transferred to a skilled rehab facility following release from the hospital, a list of the current medications will be sent to the facility for the patient to continue.  When discharged from the skilled rehab facility, please have the facility set up  the patient's Home Health Physical Therapy prior to being released. Also, the skilled facility will be responsible for providing the patient with their medications at time of release from the facility to include their pain medication, the muscle relaxants, and their blood thinner medication. If the patient is still at the rehab facility at time of the two week follow up appointment, the skilled rehab facility will also need to assist the patient in arranging follow up appointment in our office and any transportation needs.  MAKE SURE YOU:  Understand these instructions.  Get help right away if you are not doing well or get worse.    Pick up stool softner and laxative for home use following surgery while on pain medications. May shower starting three days after surgery. Please use a clean towel to pat the leg dry following showers. Continue to use ice for pain and swelling after surgery. Do not use any lotions or creams on the incision until instructed by you Start Aspirin immediately following surgery.   Do not put a pillow under the knee. Place it under the heel.   Complete by: As directed    Driving restrictions   Complete by: As directed    No driving for two weeks   TED hose   Complete by: As directed    Use stockings (TED hose) for three weeks on both leg(s).  You may remove them at night for sleeping.   Weight bearing as tolerated   Complete by: As directed      Allergies as of 10/02/2019   No Known Allergies     Medication List    STOP taking these medications   ondansetron 8 MG disintegrating tablet Commonly known as: Zofran ODT   oxyCODONE-acetaminophen 5-325 MG tablet Commonly known as: Percocet  TAKE these medications   cetirizine-pseudoephedrine 5-120 MG tablet Commonly known as: ZYRTEC-D Take 1 tablet by mouth daily before breakfast.   esomeprazole 20 MG capsule Commonly known as: NEXIUM Take 20 mg by mouth daily before breakfast.   methocarbamol 500 MG  tablet Commonly known as: Robaxin Take 1 tablet (500 mg total) by mouth 4 (four) times daily.   montelukast 10 MG tablet Commonly known as: SINGULAIR Take 10 mg by mouth daily before breakfast.   ondansetron 4 MG tablet Commonly known as: Zofran Take 1 tablet (4 mg total) by mouth daily as needed for nausea or vomiting.   oxyCODONE 5 MG immediate release tablet Commonly known as: Roxicodone Take 1 tablet (5 mg total) by mouth every 4 (four) hours as needed.   rosuvastatin 5 MG tablet Commonly known as: CRESTOR Take 5 mg by mouth daily before breakfast.   tamsulosin 0.4 MG Caps capsule Commonly known as: FLOMAX Take 1 tablet daily until kidney stone passes            Discharge Care Instructions  (From admission, onward)         Start     Ordered   10/01/19 0000  Weight bearing as tolerated     10/01/19 1350           Signed: Drue Novel, PA-C 10/11/2019, 7:49 AM

## 2019-11-04 ENCOUNTER — Ambulatory Visit: Payer: 59 | Attending: Internal Medicine

## 2019-11-04 DIAGNOSIS — Z23 Encounter for immunization: Secondary | ICD-10-CM

## 2019-11-04 NOTE — Progress Notes (Signed)
   Covid-19 Vaccination Clinic  Name:  KI CORBO    MRN: [id number] DOB: May 03, 1961  11/04/2019  Mr. Amspacher was observed post Covid-19 immunization for 15 minutes without incident. He was provided with Vaccine Information Sheet and instruction to access the V-Safe system.   Mr. Marohl was instructed to call 911 with any severe reactions post vaccine: Marland Kitchen Difficulty breathing  . Swelling of face and throat  . A fast heartbeat  . A bad rash all over body  . Dizziness and weakness   Immunizations Administered    Name Date Dose VIS Date Route   Pfizer COVID-19 Vaccine 11/04/2019  4:49 PM 0.3 mL 07/23/2019 Intramuscular   Manufacturer: [employer], [employer]   Lot: [id number]   NDC: [address]-2

## 2019-11-29 ENCOUNTER — Ambulatory Visit: Payer: 59 | Attending: Internal Medicine

## 2019-11-29 DIAGNOSIS — Z23 Encounter for immunization: Secondary | ICD-10-CM

## 2019-11-29 NOTE — Progress Notes (Signed)
   Covid-19 Vaccination Clinic  Name:  Joe [name]    MRN: [id number] DOB: 1961/08/07  11/29/2019  [name] was observed post Covid-19 immunization for 15 minutes without incident. He was provided with Vaccine Information Sheet and instruction to access the V-Safe system.   [name] was instructed to call 911 with any severe reactions post vaccine: Marland Kitchen Difficulty breathing  . Swelling of face and throat  . A fast heartbeat  . A bad rash all over body  . Dizziness and weakness   Immunizations Administered    Name Date Dose VIS Date Route   Pfizer COVID-19 Vaccine 11/29/2019  4:40 PM 0.3 mL 10/06/2018 Intramuscular   Manufacturer: [employer], [employer]   Lot: [id number]   NDC: [address]-6

## 2020-08-11 ENCOUNTER — Other Ambulatory Visit: Payer: Self-pay

## 2020-08-11 ENCOUNTER — Encounter (HOSPITAL_BASED_OUTPATIENT_CLINIC_OR_DEPARTMENT_OTHER): Payer: Self-pay | Admitting: *Deleted

## 2020-08-11 ENCOUNTER — Emergency Department (HOSPITAL_BASED_OUTPATIENT_CLINIC_OR_DEPARTMENT_OTHER)
Admission: EM | Admit: 2020-08-11 | Discharge: 2020-08-11 | Disposition: A | Discharge status: Home / Self Care | Payer: 59 | Attending: Emergency Medicine | Admitting: Emergency Medicine

## 2020-08-11 DIAGNOSIS — Z96651 Presence of right artificial knee joint: Secondary | ICD-10-CM | POA: Insufficient documentation

## 2020-08-11 DIAGNOSIS — Z5321 Procedure and treatment not carried out due to patient leaving prior to being seen by health care provider: Secondary | ICD-10-CM | POA: Insufficient documentation

## 2020-08-11 DIAGNOSIS — Z48817 Encounter for surgical aftercare following surgery on the skin and subcutaneous tissue: Secondary | ICD-10-CM | POA: Diagnosis not present

## 2020-08-11 NOTE — ED Triage Notes (Signed)
Pt reports he had right knee replacement surgery yesterday. He states he had PT after surgery and noticed bleeding through his ace wrap last night. Pt ambulated to triage with walker

## 2020-10-14 IMAGING — CT CT RENAL STONE PROTOCOL
2 of 4 series · 16 of 46 positions shown, 18 images · non-contrast
Comparison: CT dated 10/02/2018

CLINICAL DATA: 52-year-old male with left lower quadrant abdominal
pain.

EXAM:
CT ABDOMEN AND PELVIS WITHOUT CONTRAST
TECHNIQUE: Multidetector CT imaging of the abdomen and pelvis was performed
following the standard protocol without IV contrast.

[Series 2: axial st · axial · 0.88mm/px · z∈[-496,-71]mm · 13 of 93 slices shown, 15 images]
[im 4/93  soft-tissue]
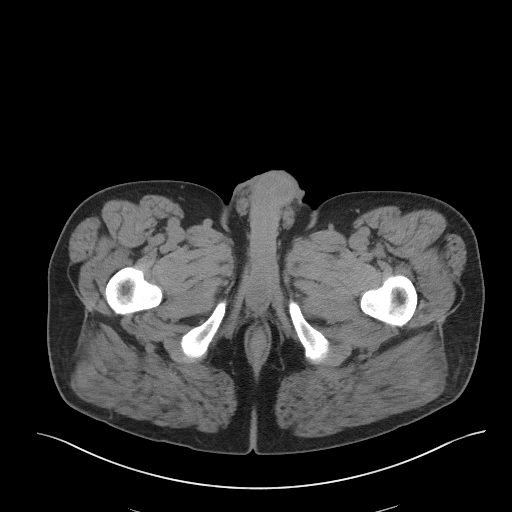
[im 4/93  bone]
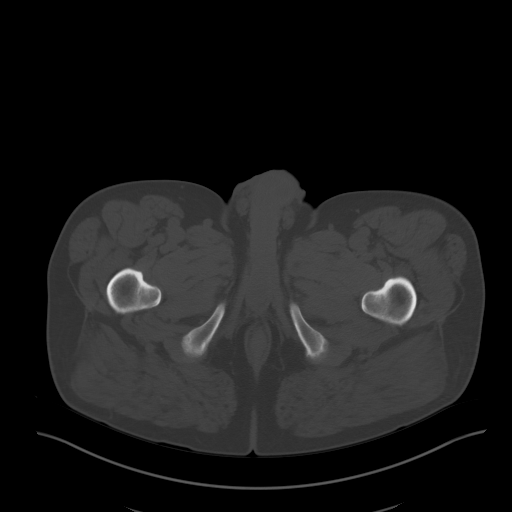
[im 12/93  soft-tissue]
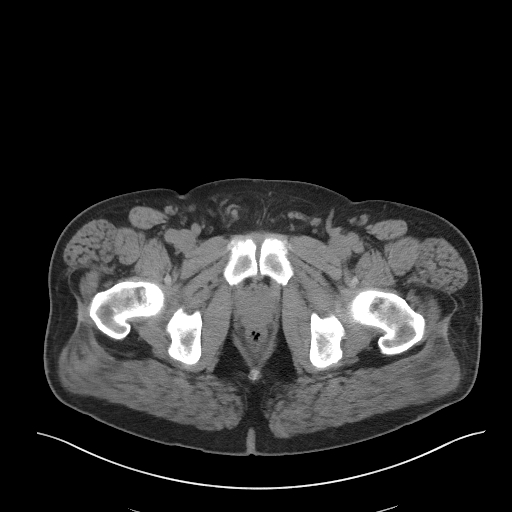
[im 20/93  soft-tissue]
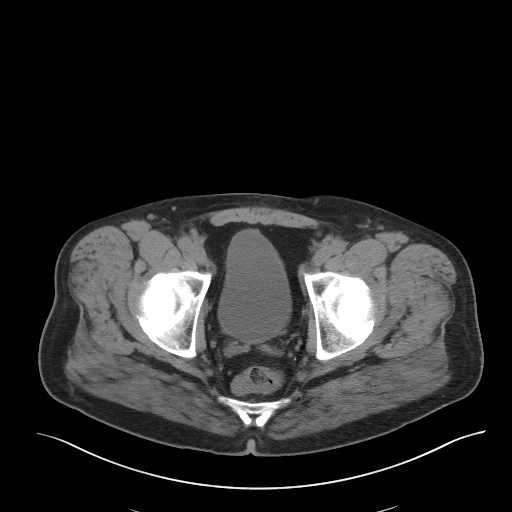
[im 27/93  soft-tissue]
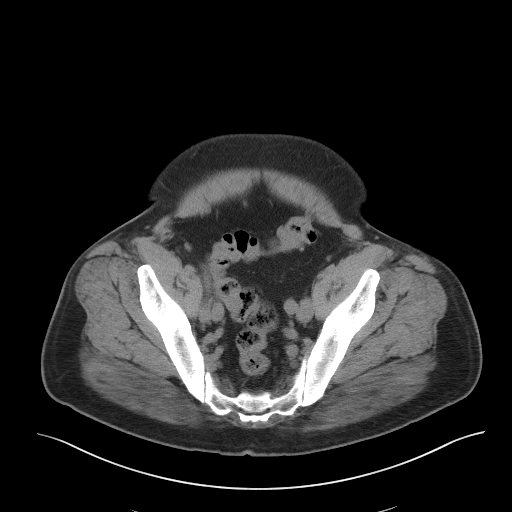
[im 31/93  soft-tissue]
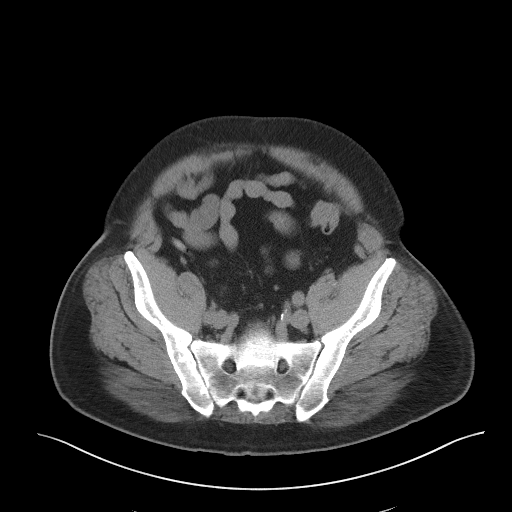
[im 39/93  soft-tissue]
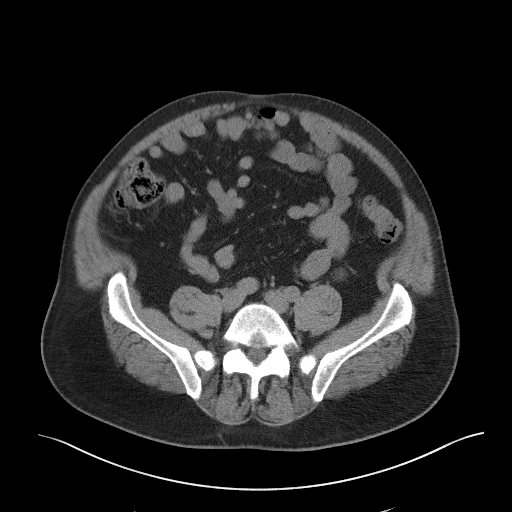
[im 47/93  soft-tissue]
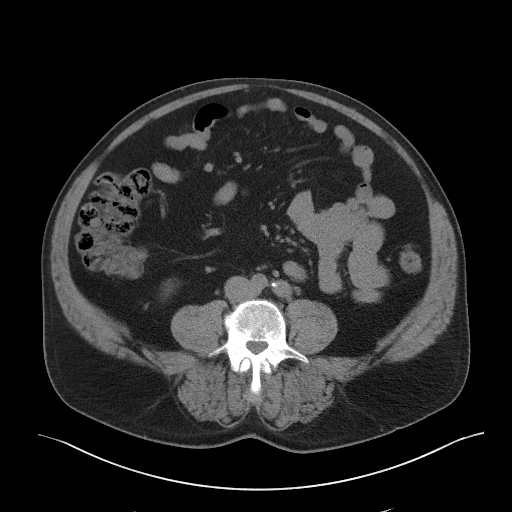
[im 54/93  soft-tissue]
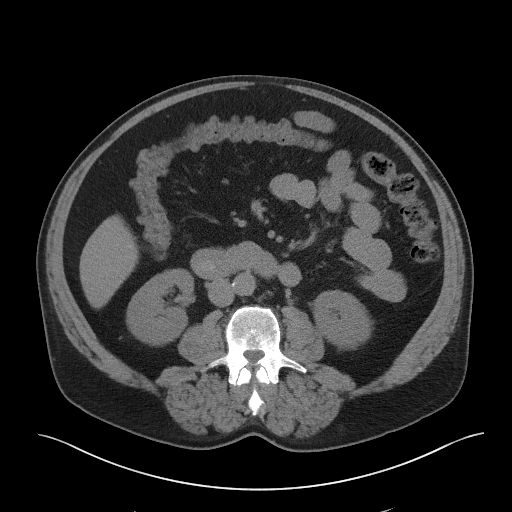
[im 62/93  soft-tissue]
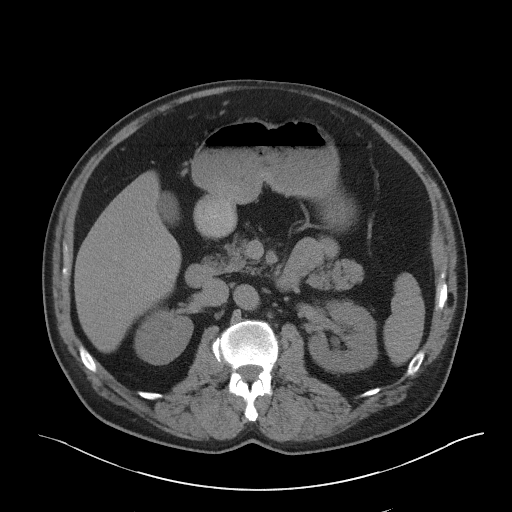
[im 62/93  bone]
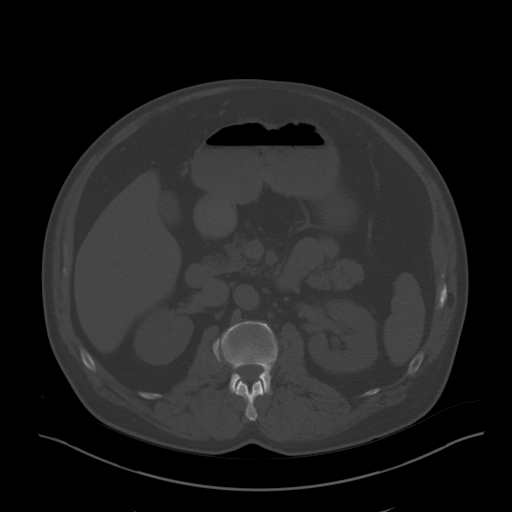
[im 66/93  soft-tissue]
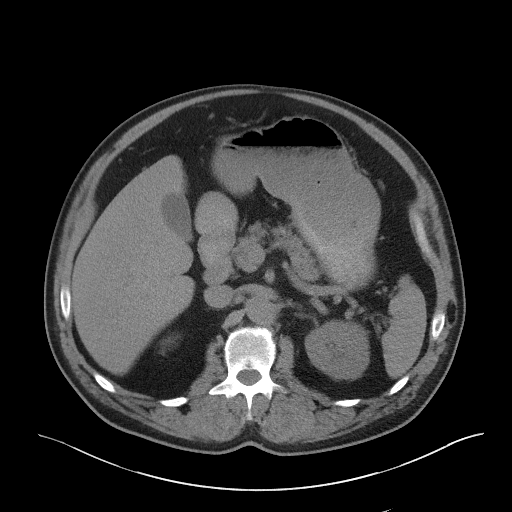
[im 73/93  soft-tissue]
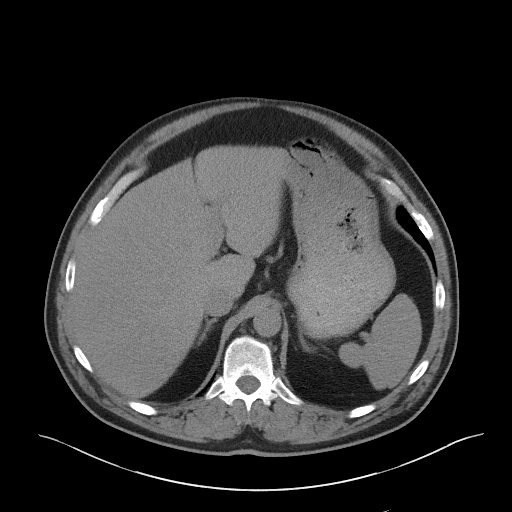
[im 81/93  soft-tissue]
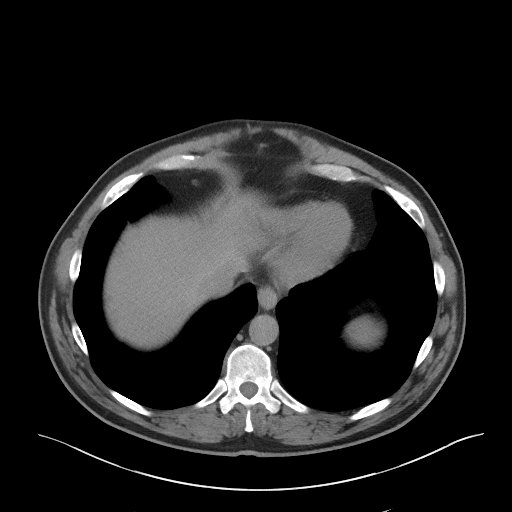
[im 89/93  soft-tissue]
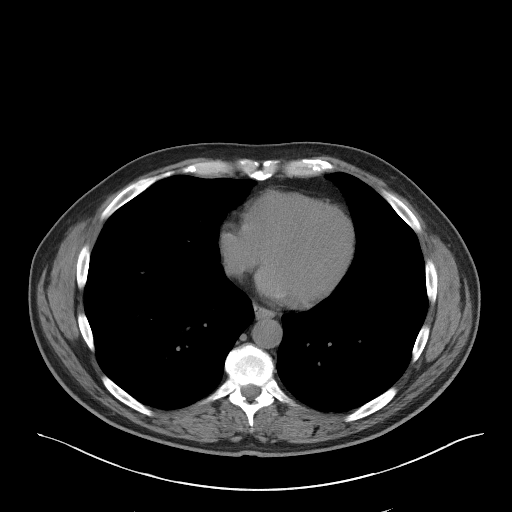

[Series 4: coronal st · coronal · 0.89mm/px · 3 of 114 slices shown]
[im 38/114  soft-tissue]
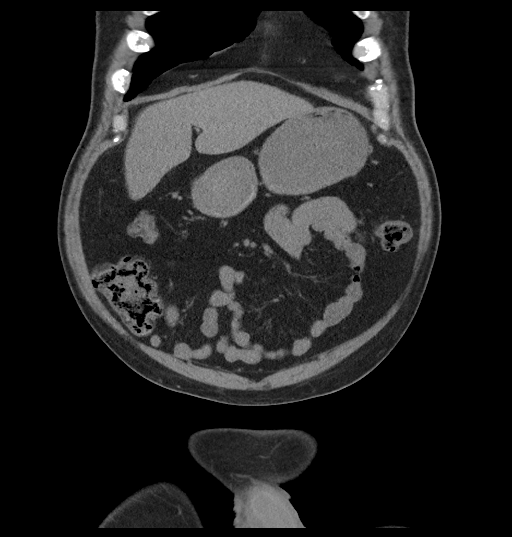
[im 51/114  soft-tissue]
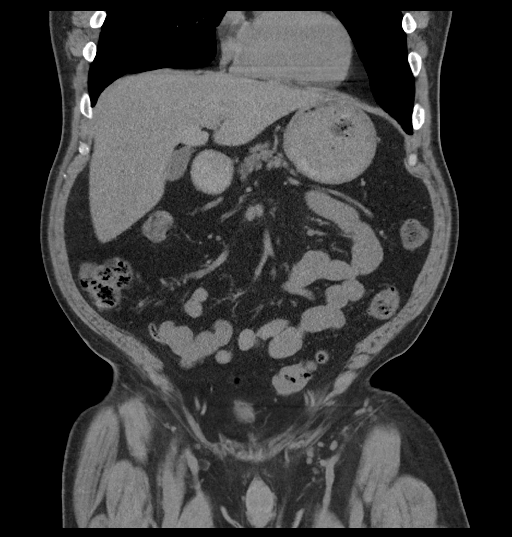
[im 63/114  soft-tissue]
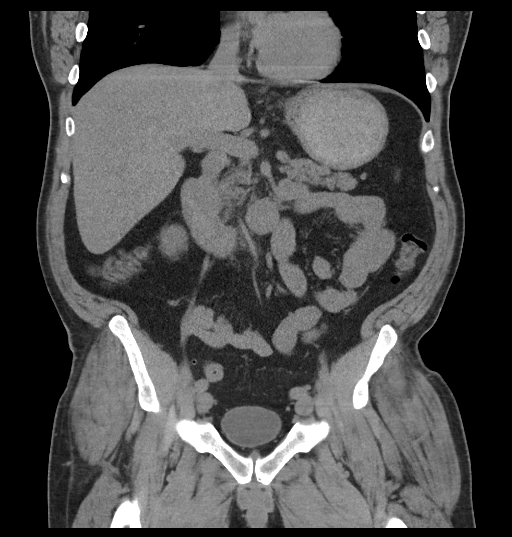

[16 of 46 positions shown; findings below may reference images not displayed]

FINDINGS: Evaluation of this exam is limited in the absence of intravenous
contrast.

Lower chest: The visualized lung bases are clear.

No intra-abdominal free air or free fluid.

Hepatobiliary: There is fatty infiltration of the liver. No
intrahepatic biliary ductal dilatation. No calcified gallstone

Pancreas: Unremarkable. No pancreatic ductal dilatation or
surrounding inflammatory changes.

Spleen: Normal in size without focal abnormality.

Adrenals/Urinary Tract: The adrenal glands are unremarkable. The
previously seen stone at the right ureteropelvic junction has
passed. There is no hydronephrosis or nephrolithiasis on the right.
There is a 3 mm stone in the distal left ureter, new since the prior
CT. There is minimal left hydronephrosis. The urinary bladder is
unremarkable. No stone identified within the bladder.

Stomach/Bowel: There is no bowel obstruction or active inflammation.
The appendix is normal.

Vascular/Lymphatic: Mild aortoiliac atherosclerotic disease. The IVC
is grossly unremarkable. No portal venous gas. There is no
adenopathy.

Reproductive: The prostate and seminal vesicles are grossly
unremarkable. No pelvic mass.

Other: Small fat containing umbilical hernia.

Musculoskeletal: No acute or significant osseous findings.
IMPRESSION: 1. A 3 mm distal left ureteral stone with minimal left
hydronephrosis.
2. Interval passage of the previously seen right UPJ calculus and
resolution of the previously seen hydronephrosis on the right.
3. Fatty liver.
4. No bowel obstruction or active inflammation. Normal appendix.

Aortic Atherosclerosis ([id number]-X87.7).

## 2022-07-29 ENCOUNTER — Other Ambulatory Visit: Payer: Self-pay | Admitting: Specialist

## 2022-07-29 DIAGNOSIS — M5451 Vertebrogenic low back pain: Secondary | ICD-10-CM

## 2022-08-14 ENCOUNTER — Ambulatory Visit
Admission: RE | Admit: 2022-08-14 | Discharge: 2022-08-14 | Disposition: A | Discharge status: Home / Self Care | Payer: Self-pay | Source: Ambulatory Visit | Attending: Specialist | Admitting: Specialist

## 2022-08-14 ENCOUNTER — Other Ambulatory Visit: Payer: Self-pay | Admitting: Specialist

## 2022-08-14 DIAGNOSIS — M25561 Pain in right knee: Secondary | ICD-10-CM

## 2022-08-14 NOTE — Discharge Instructions (Signed)

## 2022-08-15 ENCOUNTER — Ambulatory Visit
Admission: RE | Admit: 2022-08-15 | Discharge: 2022-08-15 | Disposition: A | Discharge status: Home / Self Care | Payer: 59 | Source: Ambulatory Visit | Attending: Specialist | Admitting: Specialist

## 2022-08-15 DIAGNOSIS — M5451 Vertebrogenic low back pain: Secondary | ICD-10-CM

## 2022-08-15 MED ORDER — IOPAMIDOL (ISOVUE-M 200) INJECTION 41%
20.0000 mL | Freq: Once | INTRAMUSCULAR | Status: AC
  Administered 2022-08-15: 20 mL via INTRATHECAL

## 2022-08-15 MED ORDER — ONDANSETRON HCL 4 MG/2ML IJ SOLN: 4.0000 mg | Freq: Once | INTRAMUSCULAR | Status: DC | PRN

## 2022-08-15 MED ORDER — DIAZEPAM 5 MG PO TABS
10.0000 mg | ORAL_TABLET | Freq: Once | ORAL | Status: AC
  Administered 2022-08-15: 10 mg via ORAL

## 2022-08-15 MED ORDER — MEPERIDINE HCL 50 MG/ML IJ SOLN: 50.0000 mg | Freq: Once | INTRAMUSCULAR | Status: DC | PRN

## 2022-09-10 ENCOUNTER — Ambulatory Visit: Payer: 59 | Admitting: Diagnostic Neuroimaging

## 2024-01-12 ENCOUNTER — Other Ambulatory Visit (HOSPITAL_COMMUNITY): Payer: Self-pay | Admitting: Student

## 2024-01-12 DIAGNOSIS — Z96652 Presence of left artificial knee joint: Secondary | ICD-10-CM

## 2024-01-26 ENCOUNTER — Ambulatory Visit (HOSPITAL_COMMUNITY): Payer: Self-pay

## 2024-01-26 ENCOUNTER — Encounter (HOSPITAL_COMMUNITY): Payer: Self-pay

## 2024-03-08 ENCOUNTER — Encounter (HOSPITAL_COMMUNITY)
Admission: RE | Admit: 2024-03-08 | Discharge: 2024-03-08 | Disposition: A | Discharge status: Home / Self Care | Source: Ambulatory Visit | Attending: Student | Admitting: Student

## 2024-03-08 ENCOUNTER — Encounter (HOSPITAL_COMMUNITY): Payer: Self-pay

## 2024-03-08 DIAGNOSIS — Z96652 Presence of left artificial knee joint: Secondary | ICD-10-CM | POA: Diagnosis present

## 2024-03-08 MED ORDER — TECHNETIUM TC 99M MEDRONATE IV KIT
20.0000 | PACK | Freq: Once | INTRAVENOUS | Status: AC | PRN
  Administered 2024-03-08: 22 via INTRAVENOUS

## 2024-07-21 NOTE — Patient Instructions (Addendum)
 SURGICAL WAITING ROOM VISITATION  Patients having surgery or a procedure may have no more than 2 support people in the waiting area - these visitors may rotate.    Children ages 73 and under will not be able to visit patients in [facility] under most circumstances.   Visitors with respiratory illnesses are discouraged from visiting and should remain at home.  If the patient needs to stay at the hospital during part of their recovery, the visitor guidelines for inpatient rooms apply. Pre-op nurse will coordinate an appropriate time for 1 support person to accompany patient in pre-op.  This support person may not rotate.    Please refer to the [facility] website for the visitor guidelines for Inpatients (after your surgery is over and you are in a regular room).    Your procedure is scheduled on: 08/03/24   Report to [facility] Main Entrance    Report to admitting at 9:45 AM   Call this number if you have problems the morning of surgery [phone]   Do not eat food :After Midnight.   After Midnight you may have the following liquids until 9:15 AM DAY OF SURGERY  Water  Non-Citrus Juices (without pulp, NO RED-Apple, White grape, White cranberry) Black Coffee (NO MILK/CREAM OR CREAMERS, sugar ok)  Clear Tea (NO MILK/CREAM OR CREAMERS, sugar ok) regular and decaf                             Plain Jell-O (NO RED)                                           Fruit ices (not with fruit pulp, NO RED)                                     Popsicles (NO RED)                                                               Sports drinks like Gatorade (NO RED)                 The day of surgery:  Drink ONE (1) Pre-Surgery G2 at 9:15 AM the morning of surgery. Drink in one sitting. Do not sip.  This drink was given to you during your hospital  pre-op appointment visit. Nothing else to drink after completing the  Pre-Surgery G2.          If you have questions, please contact  your surgeons office.   FOLLOW BOWEL PREP AND ANY ADDITIONAL PRE OP INSTRUCTIONS YOU RECEIVED FROM YOUR SURGEON'S OFFICE!!!     Oral Hygiene is also important to reduce your risk of infection.                                    Remember - BRUSH YOUR TEETH THE MORNING OF SURGERY WITH YOUR REGULAR TOOTHPASTE  DENTURES WILL BE REMOVED PRIOR TO SURGERY PLEASE DO NOT APPLY Poly grip  OR ADHESIVES!!!   Stop all vitamins and herbal supplements 7 days before surgery.   Take these medicines the morning of surgery with A SIP OF WATER : Inhaler, Amlodipine, Rosuvastatin               You may not have any metal on your body including hair pins, jewelry, and body piercing             Do not wear lotions, powders, cologne, or deodorant   Do not bring valuables to the hospital. [facility] IS NOT             RESPONSIBLE   FOR VALUABLES.   Contacts, glasses, dentures or bridgework may not be worn into surgery.   Bring small overnight bag day of surgery.   DO NOT BRING YOUR HOME MEDICATIONS TO THE HOSPITAL. PHARMACY WILL DISPENSE MEDICATIONS LISTED ON YOUR MEDICATION LIST TO YOU DURING YOUR ADMISSION IN THE HOSPITAL!              Please read over the following fact sheets you were given: IF YOU HAVE QUESTIONS ABOUT YOUR PRE-OP INSTRUCTIONS PLEASE CALL [phone].   If you received a COVID test during your pre-op visit  it is requested that you wear a mask when out in public, stay away from anyone that may not be feeling well and notify your surgeon if you develop symptoms. If you test positive for Covid or have been in contact with anyone that has tested positive in the last 10 days please notify you surgeon.      Pre-operative 4 CHG Bath Instructions  DYNA-Hex 4 Chlorhexidine  Gluconate 4% Solution Antiseptic 4 fl. oz   You can play a key role in reducing the risk of infection after surgery. Your skin needs to be as free of germs as possible. You can reduce the number of germs on  your skin by washing with CHG (chlorhexidine  gluconate) soap before surgery. CHG is an antiseptic soap that kills germs and continues to kill germs even after washing.   DO NOT use if you have an allergy to chlorhexidine /CHG or antibacterial soaps. If your skin becomes reddened or irritated, stop using the CHG and notify one of our RNs at   Please shower with the CHG soap starting 4 days before surgery using the following schedule:     Please keep in mind the following:  DO NOT shave, including legs and underarms, starting the day of your first shower.   You may shave your face at any point before/day of surgery.  Place clean sheets on your bed the day you start using CHG soap. Use a clean washcloth (not used since being washed) for each shower. DO NOT sleep with pets once you start using the CHG.  CHG Shower Instructions:  If you choose to wash your hair and private area, wash first with your normal shampoo/soap.  After you use shampoo/soap, rinse your hair and body thoroughly to remove shampoo/soap residue.  Turn the water  OFF and apply about 3 tablespoons (45 ml) of CHG soap to a CLEAN washcloth.  Apply CHG soap ONLY FROM YOUR NECK DOWN TO YOUR TOES (washing for 3-5 minutes)  DO NOT use CHG soap on face, private areas, open wounds, or sores.  Pay special attention to the area where your surgery is being performed.  If you are having back surgery, having someone wash your back for you may be helpful. Wait 2 minutes after CHG soap is applied, then you may rinse  off the CHG soap.  Pat dry with a clean towel  Put on clean clothes/pajamas   If you choose to wear lotion, please use ONLY the CHG-compatible lotions on the back of this paper.     Additional instructions for the day of surgery: DO NOT APPLY any lotions, deodorants, cologne, or perfumes.   Put on clean/comfortable clothes.  Brush your teeth.  Ask your nurse before applying any prescription medications to the skin.   CHG  Compatible Lotions   Aveeno Moisturizing lotion  Cetaphil Moisturizing Cream  Cetaphil Moisturizing Lotion  Clairol Herbal Essence Moisturizing Lotion, Dry Skin  Clairol Herbal Essence Moisturizing Lotion, Extra Dry Skin  Clairol Herbal Essence Moisturizing Lotion, Normal Skin  Curel Age Defying Therapeutic Moisturizing Lotion with Alpha Hydroxy  Curel Extreme Care Body Lotion  Curel Soothing Hands Moisturizing Hand Lotion  Curel Therapeutic Moisturizing Cream, Fragrance-Free  Curel Therapeutic Moisturizing Lotion, Fragrance-Free  Curel Therapeutic Moisturizing Lotion, Original Formula  Eucerin Daily Replenishing Lotion  Eucerin Dry Skin Therapy Plus Alpha Hydroxy Crme  Eucerin Dry Skin Therapy Plus Alpha Hydroxy Lotion  Eucerin Original Crme  Eucerin Original Lotion  Eucerin Plus Crme Eucerin Plus Lotion  Eucerin TriLipid Replenishing Lotion  Keri Anti-Bacterial Hand Lotion  Keri Deep Conditioning Original Lotion Dry Skin Formula Softly Scented  Keri Deep Conditioning Original Lotion, Fragrance Free Sensitive Skin Formula  Keri Lotion Fast Absorbing Fragrance Free Sensitive Skin Formula  Keri Lotion Fast Absorbing Softly Scented Dry Skin Formula  Keri Original Lotion  Keri Skin Renewal Lotion Keri Silky Smooth Lotion  Keri Silky Smooth Sensitive Skin Lotion  Nivea Body Creamy Conditioning Oil  Nivea Body Extra Enriched Teacher, Adult Education Moisturizing Lotion Nivea Crme  Nivea Skin Firming Lotion  NutraDerm 30 Skin Lotion  NutraDerm Skin Lotion  NutraDerm Therapeutic Skin Cream  NutraDerm Therapeutic Skin Lotion  ProShield Protective Hand Cream  Provon moisturizing lotion WHAT IS A BLOOD TRANSFUSION? Blood Transfusion Information  A transfusion is the replacement of blood or some of its parts. Blood is made up of multiple cells which provide different functions. Red blood cells carry oxygen and are used for blood loss replacement. White  blood cells fight against infection. Platelets control bleeding. Plasma helps clot blood. Other blood products are available for specialized needs, such as hemophilia or other clotting disorders. BEFORE THE TRANSFUSION  Who gives blood for transfusions?  Healthy volunteers who are fully evaluated to make sure their blood is safe. This is blood bank blood. Transfusion therapy is the safest it has ever been in the practice of medicine. Before blood is taken from a donor, a complete history is taken to make sure that person has no history of diseases nor engages in risky social behavior (examples are intravenous drug use or sexual activity with multiple partners). The donor's travel history is screened to minimize risk of transmitting infections, such as malaria. The donated blood is tested for signs of infectious diseases, such as HIV and hepatitis. The blood is then tested to be sure it is compatible with you in order to minimize the chance of a transfusion reaction. If you or a relative donates blood, this is often done in anticipation of surgery and is not appropriate for emergency situations. It takes many days to process the donated blood. RISKS AND COMPLICATIONS Although transfusion therapy is very safe and saves many lives, the main dangers of transfusion include:  Getting an infectious disease. Developing a transfusion reaction. This is  an allergic reaction to something in the blood you were given. Every precaution is taken to prevent this. The decision to have a blood transfusion has been considered carefully by your caregiver before blood is given. Blood is not given unless the benefits outweigh the risks. AFTER THE TRANSFUSION Right after receiving a blood transfusion, you will usually feel much better and more energetic. This is especially true if your red blood cells have gotten low (anemic). The transfusion raises the level of the red blood cells which carry oxygen, and this usually causes  an energy increase. The nurse administering the transfusion will monitor you carefully for complications. HOME CARE INSTRUCTIONS  No special instructions are needed after a transfusion. You may find your energy is better. Speak with your caregiver about any limitations on activity for underlying diseases you may have. SEEK MEDICAL CARE IF:  Your condition is not improving after your transfusion. You develop redness or irritation at the intravenous (IV) site. SEEK IMMEDIATE MEDICAL CARE IF:  Any of the following symptoms occur over the next 12 hours: Shaking chills. You have a temperature by mouth above 102 F (38.9 C), not controlled by medicine. Chest, back, or muscle pain. People around you feel you are not acting correctly or are confused. Shortness of breath or difficulty breathing. Dizziness and fainting. You get a rash or develop hives. You have a decrease in urine output. Your urine turns a dark color or changes to pink, red, or brown. Any of the following symptoms occur over the next 10 days: You have a temperature by mouth above 102 F (38.9 C), not controlled by medicine. Shortness of breath. Weakness after normal activity. The white part of the eye turns yellow (jaundice). You have a decrease in the amount of urine or are urinating less often. Your urine turns a dark color or changes to pink, red, or brown. Document Released: 07/26/2000 Document Revised: 10/21/2011 Document Reviewed: 03/14/2008 ExitCare Patient Information 2014 [place], [place].  _______________________________________________________________________  Incentive Spirometer  An incentive spirometer is a tool that can help keep your lungs clear and active. This tool measures how well you are filling your lungs with each breath. Taking long deep breaths may help reverse or decrease the chance of developing breathing (pulmonary) problems (especially infection) following: A long period of time when you are  unable to move or be active. BEFORE THE PROCEDURE  If the spirometer includes an indicator to show your best effort, your nurse or respiratory therapist will set it to a desired goal. If possible, sit up straight or lean slightly forward. Try not to slouch. Hold the incentive spirometer in an upright position. INSTRUCTIONS FOR USE  Sit on the edge of your bed if possible, or sit up as far as you can in bed or on a chair. Hold the incentive spirometer in an upright position. Breathe out normally. Place the mouthpiece in your mouth and seal your lips tightly around it. Breathe in slowly and as deeply as possible, raising the piston or the ball toward the top of the column. Hold your breath for 3-5 seconds or for as long as possible. Allow the piston or ball to fall to the bottom of the column. Remove the mouthpiece from your mouth and breathe out normally. Rest for a few seconds and repeat Steps 1 through 7 at least 10 times every 1-2 hours when you are awake. Take your time and take a few normal breaths between deep breaths. The spirometer may include an indicator to  show your best effort. Use the indicator as a goal to work toward during each repetition. After each set of 10 deep breaths, practice coughing to be sure your lungs are clear. If you have an incision (the cut made at the time of surgery), support your incision when coughing by placing a pillow or rolled up towels firmly against it. Once you are able to get out of bed, walk around indoors and cough well. You may stop using the incentive spirometer when instructed by your caregiver.  RISKS AND COMPLICATIONS Take your time so you do not get dizzy or light-headed. If you are in pain, you may need to take or ask for pain medication before doing incentive spirometry. It is harder to take a deep breath if you are having pain. AFTER USE Rest and breathe slowly and easily. It can be helpful to keep track of a log of your progress. Your  caregiver can provide you with a simple table to help with this. If you are using the spirometer at home, follow these instructions: SEEK MEDICAL CARE IF:  You are having difficultly using the spirometer. You have trouble using the spirometer as often as instructed. Your pain medication is not giving enough relief while using the spirometer. You develop fever of 100.5 F (38.1 C) or higher. SEEK IMMEDIATE MEDICAL CARE IF:  You cough up bloody sputum that had not been present before. You develop fever of 102 F (38.9 C) or greater. You develop worsening pain at or near the incision site. MAKE SURE YOU:  Understand these instructions. Will watch your condition. Will get help right away if you are not doing well or get worse. Document Released: 12/09/2006 Document Revised: 10/21/2011 Document Reviewed: 02/09/2007 [facility] Patient Information 2014 [place], [place].   ________________________________________________________________________

## 2024-07-21 NOTE — Progress Notes (Addendum)
 Date of COVID positive in last 90 days:  PCP - Elsie Lesches, MD Cardiologist - n/a  Medical clearance in media tab dated 05/27/24  Chest x-ray - N/A EKG - 07/22/24 Epic/chart Stress Test - N/A ECHO - N/A Cardiac Cath - N/A Pacemaker/ICD device last checked:N/A Spinal Cord Stimulator:N/A  Bowel Prep - N/A  Sleep Study - N/A CPAP -   Fasting Blood Sugar - preDM Checks Blood Sugar _____ times a day  Last dose of GLP1 agonist-  N/A GLP1 instructions:  Do not take after     Last dose of SGLT-2 inhibitors-  N/A SGLT-2 instructions:  Do not take after     Blood Thinner Instructions: N/A Last dose:   Time: Aspirin Instructions:N/A Last Dose:  Activity level: Can go up a flight of stairs and perform activities of daily living without stopping and without symptoms of chest pain or shortness of breath.   Anesthesia review: N/A  Patient denies shortness of breath, fever, cough and chest pain at PAT appointment  Patient verbalized understanding of instructions that were given to them at the PAT appointment. Patient was also instructed that they will need to review over the PAT instructions again at home before surgery.

## 2024-07-22 ENCOUNTER — Encounter (HOSPITAL_COMMUNITY)
Admission: RE | Admit: 2024-07-22 | Discharge: 2024-07-22 | Disposition: A | Source: Ambulatory Visit | Attending: Orthopedic Surgery

## 2024-07-22 ENCOUNTER — Other Ambulatory Visit: Payer: Self-pay

## 2024-07-22 ENCOUNTER — Encounter (HOSPITAL_COMMUNITY): Payer: Self-pay

## 2024-07-22 VITALS — BP 151/104 | HR 76 | Temp 97.8°F | Resp 16 | Ht 69.0 in | Wt 233.0 lb

## 2024-07-22 DIAGNOSIS — I10 Essential (primary) hypertension: Secondary | ICD-10-CM

## 2024-07-22 DIAGNOSIS — Z01818 Encounter for other preprocedural examination: Secondary | ICD-10-CM

## 2024-07-22 HISTORY — DX: Essential (primary) hypertension: I10

## 2024-07-22 LAB — BASIC METABOLIC PANEL WITH GFR
Anion gap: 12 (ref 5–15)
BUN: 15 mg/dL (ref 8–23)
CO2: 25 mmol/L (ref 22–32)
Calcium: 9.8 mg/dL (ref 8.9–10.3)
Chloride: 102 mmol/L (ref 98–111)
Creatinine, Ser: 0.96 mg/dL (ref 0.61–1.24)
GFR, Estimated: >60 mL/min (ref >60)
Glucose, Bld: 108 mg/dL — ABNORMAL HIGH (ref 70–99)
Potassium: 3.8 mmol/L (ref 3.5–5.1)
Sodium: 140 mmol/L (ref 135–145)

## 2024-07-22 LAB — SURGICAL PCR SCREEN
MRSA, PCR: NEGATIVE
Staphylococcus aureus: POSITIVE — AB

## 2024-07-22 LAB — CBC
HCT: 47.9 % (ref 39.0–52.0)
Hemoglobin: 16.1 g/dL (ref 13.0–17.0)
MCH: 29.5 pg (ref 26.0–34.0)
MCHC: 33.6 g/dL (ref 30.0–36.0)
MCV: 87.9 fL (ref 80.0–100.0)
Platelets: 206 K/uL (ref 150–400)
RBC: 5.45 MIL/uL (ref 4.22–5.81)
RDW: 12.2 % (ref 11.5–15.5)
WBC: 7.4 K/uL (ref 4.0–10.5)
nRBC: 0 % (ref 0.0–0.2)

## 2024-07-22 LAB — TYPE AND SCREEN
ABO/RH(D): O POS
Antibody Screen: NEGATIVE

## 2024-07-23 NOTE — Progress Notes (Signed)
[facility]APH+ results routed to Dr. Olin. 

## 2024-08-03 ENCOUNTER — Encounter (HOSPITAL_COMMUNITY)
Admission: RE | Disposition: A | Discharge status: Home / Self Care | Payer: Self-pay | Source: Ambulatory Visit | Attending: Orthopedic Surgery

## 2024-08-03 ENCOUNTER — Ambulatory Visit (HOSPITAL_COMMUNITY): Admitting: Anesthesiology

## 2024-08-03 ENCOUNTER — Observation Stay (HOSPITAL_COMMUNITY)
Admission: RE | Admit: 2024-08-03 | Discharge: 2024-08-04 | Disposition: A | Discharge status: Home / Self Care | Source: Ambulatory Visit | Attending: Orthopedic Surgery | Admitting: Orthopedic Surgery

## 2024-08-03 ENCOUNTER — Other Ambulatory Visit: Payer: Self-pay

## 2024-08-03 ENCOUNTER — Encounter (HOSPITAL_COMMUNITY): Payer: Self-pay | Admitting: Orthopedic Surgery

## 2024-08-03 ENCOUNTER — Encounter (HOSPITAL_COMMUNITY): Payer: Self-pay | Admitting: Medical

## 2024-08-03 DIAGNOSIS — Z87891 Personal history of nicotine dependence: Secondary | ICD-10-CM | POA: Diagnosis not present

## 2024-08-03 DIAGNOSIS — I10 Essential (primary) hypertension: Secondary | ICD-10-CM | POA: Diagnosis not present

## 2024-08-03 DIAGNOSIS — Z96653 Presence of artificial knee joint, bilateral: Secondary | ICD-10-CM | POA: Insufficient documentation

## 2024-08-03 DIAGNOSIS — T84093A Other mechanical complication of internal left knee prosthesis, initial encounter: Secondary | ICD-10-CM | POA: Diagnosis not present

## 2024-08-03 DIAGNOSIS — J45909 Unspecified asthma, uncomplicated: Secondary | ICD-10-CM | POA: Diagnosis not present

## 2024-08-03 DIAGNOSIS — Y792 Prosthetic and other implants, materials and accessory orthopedic devices associated with adverse incidents: Secondary | ICD-10-CM | POA: Diagnosis not present

## 2024-08-03 DIAGNOSIS — Z79899 Other long term (current) drug therapy: Secondary | ICD-10-CM | POA: Diagnosis not present

## 2024-08-03 DIAGNOSIS — Z96652 Presence of left artificial knee joint: Principal | ICD-10-CM

## 2024-08-03 HISTORY — PX: TOTAL KNEE REVISION: SHX996

## 2024-08-03 SURGERY — TOTAL KNEE REVISION
Anesthesia: Spinal | Site: Knee | Laterality: Left

## 2024-08-03 MED ORDER — MUPIROCIN 2 % EX OINT: 1.0000 | TOPICAL_OINTMENT | Freq: Two times a day (BID) | CUTANEOUS | 0 refills | Status: AC

## 2024-08-03 MED ORDER — ONDANSETRON HCL 4 MG/2ML IJ SOLN
INTRAMUSCULAR | Status: AC
  Filled 2024-08-03: qty 2

## 2024-08-03 MED ORDER — SODIUM CHLORIDE (PF) 0.9 % IJ SOLN
INTRAMUSCULAR | Status: DC | PRN
  Administered 2024-08-03: 61 mL

## 2024-08-03 MED ORDER — DIPHENHYDRAMINE HCL 12.5 MG/5ML PO ELIX: 12.5000 mg | ORAL_SOLUTION | ORAL | Status: DC | PRN

## 2024-08-03 MED ORDER — HYDROMORPHONE HCL 1 MG/ML IJ SOLN: 0.2500 mg | INTRAMUSCULAR | Status: DC | PRN

## 2024-08-03 MED ORDER — BUPIVACAINE IN DEXTROSE 0.75-8.25 % IT SOLN
INTRATHECAL | Status: DC | PRN
  Administered 2024-08-03: 1.8 mL via INTRATHECAL

## 2024-08-03 MED ORDER — VANCOMYCIN HCL 1000 MG IV SOLR
INTRAVENOUS | Status: AC
  Filled 2024-08-03: qty 20

## 2024-08-03 MED ORDER — MENTHOL 3 MG MT LOZG: 1.0000 | LOZENGE | OROMUCOSAL | Status: DC | PRN

## 2024-08-03 MED ORDER — EPHEDRINE SULFATE (PRESSORS) 25 MG/5ML IV SOSY
PREFILLED_SYRINGE | INTRAVENOUS | Status: DC | PRN
  Administered 2024-08-03 (×4): 5 mg via INTRAVENOUS

## 2024-08-03 MED ORDER — LACTATED RINGERS IV SOLN: INTRAVENOUS | Status: DC

## 2024-08-03 MED ORDER — OXYCODONE HCL 5 MG PO TABS
5.0000 mg | ORAL_TABLET | ORAL | Status: DC | PRN
  Administered 2024-08-03: 5 mg via ORAL
  Filled 2024-08-03: qty 1

## 2024-08-03 MED ORDER — ALBUTEROL SULFATE (2.5 MG/3ML) 0.083% IN NEBU: 2.5000 mg | INHALATION_SOLUTION | Freq: Four times a day (QID) | RESPIRATORY_TRACT | Status: DC | PRN

## 2024-08-03 MED ORDER — SODIUM CHLORIDE 0.9 % IR SOLN
Status: DC | PRN
  Administered 2024-08-03: 1000 mL

## 2024-08-03 MED ORDER — LOSARTAN POTASSIUM 50 MG PO TABS
50.0000 mg | ORAL_TABLET | Freq: Every day | ORAL | Status: DC
  Administered 2024-08-04: 50 mg via ORAL
  Filled 2024-08-03: qty 1

## 2024-08-03 MED ORDER — CHLORHEXIDINE GLUCONATE 0.12 % MT SOLN
15.0000 mL | Freq: Once | OROMUCOSAL | Status: AC
  Administered 2024-08-03: 15 mL via OROMUCOSAL

## 2024-08-03 MED ORDER — PHENYLEPHRINE 80 MCG/ML (10ML) SYRINGE FOR IV PUSH (FOR BLOOD PRESSURE SUPPORT)
PREFILLED_SYRINGE | INTRAVENOUS | Status: AC
  Filled 2024-08-03: qty 10

## 2024-08-03 MED ORDER — HYDROMORPHONE HCL 1 MG/ML IJ SOLN
0.5000 mg | INTRAMUSCULAR | Status: DC | PRN
  Administered 2024-08-03: 1 mg via INTRAVENOUS
  Filled 2024-08-03: qty 1

## 2024-08-03 MED ORDER — AMLODIPINE BESYLATE 10 MG PO TABS
10.0000 mg | ORAL_TABLET | Freq: Every day | ORAL | Status: DC
  Administered 2024-08-04: 10 mg via ORAL
  Filled 2024-08-03: qty 1

## 2024-08-03 MED ORDER — ROPIVACAINE HCL 5 MG/ML IJ SOLN
INTRAMUSCULAR | Status: DC | PRN
  Administered 2024-08-03: 30 mL via PERINEURAL

## 2024-08-03 MED ORDER — VANCOMYCIN HCL 1000 MG IV SOLR
INTRAVENOUS | Status: DC | PRN
  Administered 2024-08-03: 2 g via TOPICAL

## 2024-08-03 MED ORDER — SODIUM CHLORIDE 0.9 % IV SOLN: INTRAVENOUS | Status: DC

## 2024-08-03 MED ORDER — METOCLOPRAMIDE HCL 5 MG/ML IJ SOLN: 5.0000 mg | Freq: Three times a day (TID) | INTRAMUSCULAR | Status: DC | PRN

## 2024-08-03 MED ORDER — SODIUM CHLORIDE (PF) 0.9 % IJ SOLN
INTRAMUSCULAR | Status: AC
  Filled 2024-08-03: qty 30

## 2024-08-03 MED ORDER — METOCLOPRAMIDE HCL 5 MG PO TABS: 5.0000 mg | ORAL_TABLET | Freq: Three times a day (TID) | ORAL | Status: DC | PRN

## 2024-08-03 MED ORDER — PHENOL 1.4 % MT LIQD: 1.0000 | OROMUCOSAL | Status: DC | PRN

## 2024-08-03 MED ORDER — CLONIDINE HCL (ANALGESIA) 100 MCG/ML EP SOLN
EPIDURAL | Status: DC | PRN
  Administered 2024-08-03: 70 ug

## 2024-08-03 MED ORDER — PROPOFOL 500 MG/50ML IV EMUL
INTRAVENOUS | Status: DC | PRN
  Administered 2024-08-03: 150 ug/kg/min via INTRAVENOUS
  Administered 2024-08-03: 30 mg via INTRAVENOUS
  Administered 2024-08-03: 50 mg via INTRAVENOUS

## 2024-08-03 MED ORDER — PROPOFOL 10 MG/ML IV BOLUS
INTRAVENOUS | Status: AC
  Filled 2024-08-03: qty 20

## 2024-08-03 MED ORDER — KETOROLAC TROMETHAMINE 15 MG/ML IJ SOLN
7.5000 mg | Freq: Four times a day (QID) | INTRAMUSCULAR | Status: DC
  Administered 2024-08-03 – 2024-08-04 (×3): 7.5 mg via INTRAVENOUS
  Filled 2024-08-03 (×3): qty 1

## 2024-08-03 MED ORDER — DROPERIDOL 2.5 MG/ML IJ SOLN: 0.6250 mg | Freq: Once | INTRAMUSCULAR | Status: DC | PRN

## 2024-08-03 MED ORDER — PROPOFOL 1000 MG/100ML IV EMUL
INTRAVENOUS | Status: AC
  Filled 2024-08-03: qty 100

## 2024-08-03 MED ORDER — BUPIVACAINE-EPINEPHRINE (PF) 0.25% -1:200000 IJ SOLN
INTRAMUSCULAR | Status: AC
  Filled 2024-08-03: qty 30

## 2024-08-03 MED ORDER — ONDANSETRON HCL 4 MG/2ML IJ SOLN
INTRAMUSCULAR | Status: DC | PRN
  Administered 2024-08-03: 4 mg via INTRAVENOUS

## 2024-08-03 MED ORDER — CYCLOBENZAPRINE HCL 5 MG PO TABS
5.0000 mg | ORAL_TABLET | Freq: Three times a day (TID) | ORAL | Status: DC | PRN
  Administered 2024-08-04: 5 mg via ORAL
  Filled 2024-08-03: qty 1

## 2024-08-03 MED ORDER — DEXAMETHASONE SODIUM PHOSPHATE 4 MG/ML IJ SOLN
INTRAMUSCULAR | Status: DC | PRN
  Administered 2024-08-03: 5 mg via PERINEURAL

## 2024-08-03 MED ORDER — SENNA 8.6 MG PO TABS
2.0000 | ORAL_TABLET | Freq: Every day | ORAL | Status: DC
  Administered 2024-08-03: 17.2 mg via ORAL
  Filled 2024-08-03: qty 2

## 2024-08-03 MED ORDER — 0.9 % SODIUM CHLORIDE (POUR BTL) OPTIME
TOPICAL | Status: DC | PRN
  Administered 2024-08-03: 1000 mL

## 2024-08-03 MED ORDER — POLYETHYLENE GLYCOL 3350 17 G PO PACK
17.0000 g | PACK | Freq: Two times a day (BID) | ORAL | Status: DC
  Administered 2024-08-03 – 2024-08-04 (×2): 17 g via ORAL
  Filled 2024-08-03 (×2): qty 1

## 2024-08-03 MED ORDER — ONDANSETRON HCL 4 MG PO TABS: 4.0000 mg | ORAL_TABLET | Freq: Four times a day (QID) | ORAL | Status: DC | PRN

## 2024-08-03 MED ORDER — ACETAMINOPHEN 500 MG PO TABS
1000.0000 mg | ORAL_TABLET | Freq: Once | ORAL | Status: AC
  Administered 2024-08-03: 1000 mg via ORAL
  Filled 2024-08-03: qty 2

## 2024-08-03 MED ORDER — TRANEXAMIC ACID-NACL 1000-0.7 MG/100ML-% IV SOLN
1000.0000 mg | Freq: Once | INTRAVENOUS | Status: AC
  Administered 2024-08-03: 1000 mg via INTRAVENOUS
  Filled 2024-08-03: qty 100

## 2024-08-03 MED ORDER — ACETAMINOPHEN 500 MG PO TABS
1000.0000 mg | ORAL_TABLET | Freq: Four times a day (QID) | ORAL | Status: DC
  Administered 2024-08-03 – 2024-08-04 (×3): 1000 mg via ORAL
  Filled 2024-08-03 (×3): qty 2

## 2024-08-03 MED ORDER — OXYCODONE HCL 5 MG PO TABS
10.0000 mg | ORAL_TABLET | ORAL | Status: DC | PRN
  Administered 2024-08-04: 10 mg via ORAL
  Filled 2024-08-03: qty 2

## 2024-08-03 MED ORDER — STERILE WATER FOR INJECTION IJ SOLN
INTRAMUSCULAR | Status: DC | PRN
  Administered 2024-08-03: 2000 mL

## 2024-08-03 MED ORDER — DEXAMETHASONE SOD PHOSPHATE PF 10 MG/ML IJ SOLN: 8.0000 mg | Freq: Once | INTRAMUSCULAR | Status: DC

## 2024-08-03 MED ORDER — CEFAZOLIN SODIUM-DEXTROSE 2-4 GM/100ML-% IV SOLN
2.0000 g | Freq: Four times a day (QID) | INTRAVENOUS | Status: AC
  Administered 2024-08-03 – 2024-08-04 (×2): 2 g via INTRAVENOUS
  Filled 2024-08-03 (×2): qty 100

## 2024-08-03 MED ORDER — ROSUVASTATIN CALCIUM 5 MG PO TABS
5.0000 mg | ORAL_TABLET | Freq: Every day | ORAL | Status: DC
  Administered 2024-08-04: 5 mg via ORAL
  Filled 2024-08-03: qty 1

## 2024-08-03 MED ORDER — ASPIRIN 81 MG PO CHEW
81.0000 mg | CHEWABLE_TABLET | Freq: Two times a day (BID) | ORAL | Status: DC
  Administered 2024-08-03 – 2024-08-04 (×2): 81 mg via ORAL
  Filled 2024-08-03 (×2): qty 1

## 2024-08-03 MED ORDER — ALUM & MAG HYDROXIDE-SIMETH 200-200-20 MG/5ML PO SUSP: 30.0000 mL | ORAL | Status: DC | PRN

## 2024-08-03 MED ORDER — TRANEXAMIC ACID-NACL 1000-0.7 MG/100ML-% IV SOLN
1000.0000 mg | INTRAVENOUS | Status: AC
  Administered 2024-08-03: 1000 mg via INTRAVENOUS
  Filled 2024-08-03: qty 100

## 2024-08-03 MED ORDER — DEXAMETHASONE SOD PHOSPHATE PF 10 MG/ML IJ SOLN
10.0000 mg | Freq: Once | INTRAMUSCULAR | Status: AC
  Administered 2024-08-04: 10 mg via INTRAVENOUS

## 2024-08-03 MED ORDER — EPHEDRINE 5 MG/ML INJ
INTRAVENOUS | Status: AC
  Filled 2024-08-03: qty 5

## 2024-08-03 MED ORDER — POVIDONE-IODINE 10 % EX SWAB: 2.0000 | Freq: Once | CUTANEOUS | Status: DC

## 2024-08-03 MED ORDER — ONDANSETRON HCL 4 MG/2ML IJ SOLN: 4.0000 mg | Freq: Four times a day (QID) | INTRAMUSCULAR | Status: DC | PRN

## 2024-08-03 MED ORDER — ALBUTEROL SULFATE HFA 108 (90 BASE) MCG/ACT IN AERS: 2.0000 | INHALATION_SPRAY | Freq: Four times a day (QID) | RESPIRATORY_TRACT | Status: DC | PRN

## 2024-08-03 MED ORDER — MIDAZOLAM HCL (PF) 2 MG/2ML IJ SOLN
2.0000 mg | INTRAMUSCULAR | Status: DC
  Administered 2024-08-03: 1 mg via INTRAVENOUS
  Filled 2024-08-03: qty 2

## 2024-08-03 MED ORDER — KETOROLAC TROMETHAMINE 30 MG/ML IJ SOLN
INTRAMUSCULAR | Status: AC
  Filled 2024-08-03: qty 1

## 2024-08-03 MED ORDER — MONTELUKAST SODIUM 10 MG PO TABS
10.0000 mg | ORAL_TABLET | Freq: Every day | ORAL | Status: DC
  Administered 2024-08-03: 10 mg via ORAL
  Filled 2024-08-03: qty 1

## 2024-08-03 MED ORDER — FENTANYL CITRATE (PF) 50 MCG/ML IJ SOSY
100.0000 ug | PREFILLED_SYRINGE | INTRAMUSCULAR | Status: DC
  Administered 2024-08-03: 50 ug via INTRAVENOUS
  Filled 2024-08-03: qty 2

## 2024-08-03 MED ORDER — ORAL CARE MOUTH RINSE: 15.0000 mL | Freq: Once | OROMUCOSAL | Status: AC

## 2024-08-03 MED ORDER — BISACODYL 10 MG RE SUPP: 10.0000 mg | Freq: Every day | RECTAL | Status: DC | PRN

## 2024-08-03 MED ORDER — CEFAZOLIN SODIUM-DEXTROSE 2-4 GM/100ML-% IV SOLN
2.0000 g | INTRAVENOUS | Status: AC
  Administered 2024-08-03: 2 g via INTRAVENOUS
  Filled 2024-08-03: qty 100

## 2024-08-03 SURGICAL SUPPLY — 56 items
BAG COUNTER SPONGE SURGICOUNT (BAG) IMPLANT
BAG DECANTER FOR FLEXI CONT (MISCELLANEOUS) IMPLANT
BAG ZIPLOCK 12X15 (MISCELLANEOUS) IMPLANT
BLADE SAW SGTL 11.0X1.19X90.0M (BLADE) IMPLANT
BLADE SAW SGTL 13.0X1.19X90.0M (BLADE) ×1 IMPLANT
BLADE SAW SGTL 81X20 HD (BLADE) ×1 IMPLANT
BNDG ELASTIC 6INX 5YD STR LF (GAUZE/BANDAGES/DRESSINGS) ×1 IMPLANT
BOWL SMART MIX CTS (DISPOSABLE) IMPLANT
BRUSH FEMORAL CANAL (MISCELLANEOUS) IMPLANT
CEMENT HV SMART SET (Cement) IMPLANT
COMPONENT FEM ATN CRS SZ6 LT (Femur) IMPLANT
CONE SLEEVE ATTUNE KNEE SM (Sleeve) IMPLANT
COVER SURGICAL LIGHT HANDLE (MISCELLANEOUS) ×1 IMPLANT
CUFF TRNQT CYL 34X4.125X (TOURNIQUET CUFF) ×1 IMPLANT
DERMABOND ADVANCED .7 DNX12 (GAUZE/BANDAGES/DRESSINGS) ×1 IMPLANT
DRAPE U-SHAPE 47X51 STRL (DRAPES) ×1 IMPLANT
DRSG AQUACEL AG ADV 3.5X10 (GAUZE/BANDAGES/DRESSINGS) ×1 IMPLANT
DRSG AQUACEL AG ADV 3.5X14 (GAUZE/BANDAGES/DRESSINGS) IMPLANT
DURAPREP 26ML APPLICATOR (WOUND CARE) ×2 IMPLANT
ELECT REM PT RETURN 15FT ADLT (MISCELLANEOUS) ×1 IMPLANT
GAUZE SPONGE 2X2 8PLY STRL LF (GAUZE/BANDAGES/DRESSINGS) IMPLANT
GLOVE BIO SURGEON STRL SZ 6 (GLOVE) ×1 IMPLANT
GLOVE BIOGEL PI IND STRL 6.5 (GLOVE) ×1 IMPLANT
GLOVE BIOGEL PI IND STRL 7.5 (GLOVE) ×1 IMPLANT
GLOVE ORTHO TXT STRL SZ7.5 (GLOVE) ×2 IMPLANT
GOWN STRL REUS W/ TWL LRG LVL3 (GOWN DISPOSABLE) ×2 IMPLANT
HOLDER FOLEY CATH W/STRAP (MISCELLANEOUS) IMPLANT
INSERT TIB CRS ATTUNE SZ6 8 (Insert) IMPLANT
KIT TURNOVER KIT A (KITS) ×1 IMPLANT
MANIFOLD NEPTUNE II (INSTRUMENTS) ×1 IMPLANT
NDL SAFETY ECLIPSE 18X1.5 (NEEDLE) ×1 IMPLANT
NS IRRIG 1000ML POUR BTL (IV SOLUTION) ×1 IMPLANT
PACK TOTAL KNEE CUSTOM (KITS) ×1 IMPLANT
PENCIL SMOKE EVACUATOR (MISCELLANEOUS) ×1 IMPLANT
PIN FIX SIGMA LCS THRD HI (PIN) IMPLANT
PLATE REV TIB BAS ROT SZ5 KNEE (Orthopedic Implant) IMPLANT
PROTECTOR NERVE ULNAR (MISCELLANEOUS) ×1 IMPLANT
RESTRICTOR CEMENT SZ 5 C-STEM (Cement) IMPLANT
SET HNDPC FAN SPRY TIP SCT (DISPOSABLE) ×1 IMPLANT
SET PAD KNEE POSITIONER (MISCELLANEOUS) ×1 IMPLANT
SLEEVE ATTUNE TIB M/L 53 (Orthopedic Implant) IMPLANT
SOLUTION PRONTOSAN WOUND 350ML (IRRIGATION / IRRIGATOR) IMPLANT
SPIKE FLUID TRANSFER (MISCELLANEOUS) IMPLANT
STAPLER SKIN PROX 35W (STAPLE) IMPLANT
STEM CEMT ATTUNE 14X80 (Knees) IMPLANT
STEM REV CEMENTED 14X50MM (Stem) IMPLANT
SUT MNCRL AB 3-0 PS2 18 (SUTURE) ×1 IMPLANT
SUT STRATAFIX PDS+ 0 24IN (SUTURE) ×1 IMPLANT
SUT VIC AB 1 CT1 36 (SUTURE) ×1 IMPLANT
SUT VIC AB 2-0 CT1 TAPERPNT 27 (SUTURE) ×3 IMPLANT
SYR 3ML LL SCALE MARK (SYRINGE) ×1 IMPLANT
TOWER CARTRIDGE SMART MIX (DISPOSABLE) IMPLANT
TRAY FOLEY MTR SLVR 16FR STAT (SET/KITS/TRAYS/PACK) ×1 IMPLANT
TUBE SUCTION HIGH CAP CLEAR NV (SUCTIONS) ×1 IMPLANT
WATER STERILE IRR 1000ML POUR (IV SOLUTION) ×1 IMPLANT
WRAP KNEE MAXI GEL POST OP (GAUZE/BANDAGES/DRESSINGS) ×1 IMPLANT

## 2024-08-03 NOTE — Progress Notes (Signed)
 Risks and benefits of the surgery were discussed with the patient and Dr. Ernie at their previous office visit, and the patient has elected to move forward with the aforementioned surgery. Post-operative care plans were discussed with the patient today and all patient questions were answered. Therapy Plans: outpatient therapy at [facility]  Disposition: Home with wife Planned DVT Prophylaxis: aspirin  81mg  BID DME needed: walker PCP: Dr. Elsie Lesches - clearance received  TXA: IV Allergies: atorvastatin  Anesthesia Concerns: none BMI: 34.6 Last HgbA1c: Not diabetic    Other: - No hx of VTE or cancer - oxycodone , tylenol , flexeril , celebrex vs prednisone - lumbar surgery this year L4/5 fusion with Dr. Mavis     Instructed patient on which medications to discontinue 5 days prior to surgery. Will follow-up in office with Dr. Ernie 2 weeks post-op.

## 2024-08-03 NOTE — Plan of Care (Signed)
" °  Problem: Education: Goal: Knowledge of General Education information will improve Description: Including pain rating scale, medication(s)/side effects and non-pharmacologic comfort measures 08/03/2024 2032 by Debora Ou, RN Outcome: Progressing 08/03/2024 2032 by Debora Ou, RN Outcome: Progressing   "

## 2024-08-03 NOTE — Anesthesia Postprocedure Evaluation (Signed)
"   Anesthesia Post Note  Patient: Joe [name]  Procedure(s) Performed: TOTAL KNEE REVISION (Left: Knee)     Patient location during evaluation: PACU Anesthesia Type: Spinal Level of consciousness: awake and alert Pain management: pain level controlled Vital Signs Assessment: post-procedure vital signs reviewed and stable Respiratory status: spontaneous breathing and respiratory function stable Cardiovascular status: blood pressure returned to baseline and stable Postop Assessment: spinal receding and no apparent nausea or vomiting Anesthetic complications: no   No notable events documented.  Last Vitals:  Vitals:   08/03/24 1630 08/03/24 1654  BP: 104/85 121/61  Pulse: (!) 58 (!) 58  Resp: 12 16  Temp:  36.5 C  SpO2: 92% 94%    Last Pain:  Vitals:   08/03/24 1654  TempSrc: Oral  PainSc: 0-No pain                 Debby FORBES Like      "

## 2024-08-03 NOTE — Evaluation (Signed)
 Physical Therapy Evaluation Patient Details Name: Joe [name] MRN: [id number] DOB: 06-19-1961 Today's Date: 08/03/2024  History of Present Illness  Pt is 63 yo male admitted on 08/03/24 for L TKA revision. Pt with hx including but not limited to arthritis, HTN, HCL, back sx (2025), R TKA, L TKA 2021.  Clinical Impression  Pt is s/p TKA revision resulting in the deficits listed below (see PT Problem List). At baseline, pt independent. He has home support.  Does have a flight of steps to bedroom but reports could sleep downstairs if he had to.  Today, pt with good muscle activation and ability to SLR.  He did report some tingling still in feet but improved with sitting; however, with standing feet felt numb again.  Pt compensating well with RW and took side steps for repositioning then returned to supine.  Expected to progress well as spinal continues to wear off.  Pt will benefit from acute skilled PT to increase their independence and safety with mobility to allow discharge.          If plan is discharge home, recommend the following: A little help with walking and/or transfers;A little help with bathing/dressing/bathroom;Assistance with cooking/housework;Help with stairs or ramp for entrance   Can travel by private vehicle        Equipment Recommendations Rolling walker (2 wheels)  Recommendations for Other Services       Functional Status Assessment Patient has had a recent decline in their functional status and demonstrates the ability to make significant improvements in function in a reasonable and predictable amount of time.     Precautions / Restrictions Precautions Precautions: Fall;Knee Restrictions LLE Weight Bearing Per Provider Order: Weight bearing as tolerated      Mobility  Bed Mobility Overal bed mobility: Needs Assistance Bed Mobility: Supine to Sit, Sit to Supine     Supine to sit: Supervision Sit to supine: Min assist   General bed mobility comments:  min A L LE back to bed    Transfers Overall transfer level: Needs assistance Equipment used: Rolling walker (2 wheels) Transfers: Sit to/from Stand Sit to Stand: Min assist           General transfer comment: cues for hand placement , light min A to stabilize    Ambulation/Gait Ambulation/Gait assistance: Min assist Gait Distance (Feet): 1 Feet Assistive device: Rolling walker (2 wheels) Gait Pattern/deviations: Step-to pattern, Decreased stride length Gait velocity: decreased     General Gait Details: side steps at EOB; still felt feet numb upon standing so stayed at [employer]            Wheelchair Mobility     Tilt Bed    Modified Rankin (Stroke Patients Only)       Balance Overall balance assessment: Needs assistance Sitting-balance support: No upper extremity supported Sitting balance-Leahy Scale: Good     Standing balance support: Bilateral upper extremity supported, Reliant on assistive device for balance Standing balance-Leahy Scale: Poor Standing balance comment: Steady static stand with RW - did not furter challenge                             Pertinent Vitals/Pain Pain Assessment Pain Assessment: No/denies pain    Home Living Family/patient expects to be discharged to:: Private residence Living Arrangements: Spouse/significant other Available Help at Discharge: Family;Available 24 hours/day Type of Home: House Home Access: Stairs to enter Entrance Stairs-Rails: Doctor, General Practice of  Steps: 4 Alternate Level Stairs-Number of Steps: flight Home Layout: Two level;Bed/bath upstairs;1/2 bath on main level Home Equipment: Toilet riser;Cane - single point;Crutches      Prior Function Prior Level of Function : Independent/Modified Independent;Working/employed;Driving             Mobility Comments: Ambulated without AD       Extremity/Trunk Assessment   Upper Extremity Assessment Upper Extremity  Assessment: Overall WFL for tasks assessed    Lower Extremity Assessment Lower Extremity Assessment: LLE deficits/detail;RLE deficits/detail RLE Deficits / Details: ROM WFL; MMT 5/5 RLE Sensation: decreased light touch (REports feet feel tingling in supine, improved with sitting, but then feeling numb in standing; pt had spinal for sx) LLE Deficits / Details: Expected post op changes; ROM knee 3 to 80 degrees; MMT: ankle DF 4/5, knee ext 3/5, hip 3/5 LLE Sensation: decreased light touch (REports feet feel tingling in supine, improved with sitting, but then feeling numb in standing; pt had spinal for sx)    Cervical / Trunk Assessment Cervical / Trunk Assessment: Normal  Communication        Cognition Arousal: Alert Behavior During Therapy: WFL for tasks assessed/performed   PT - Cognitive impairments: No apparent impairments                                 Cueing       General Comments      Exercises     Assessment/Plan    PT Assessment Patient needs continued PT services  PT Problem List Decreased strength;Decreased range of motion;Decreased activity tolerance;Decreased balance;Decreased mobility;Pain;Decreased knowledge of precautions       PT Treatment Interventions DME instruction;Therapeutic exercise;Gait training;Stair training;Functional mobility training;Therapeutic activities;Patient/family education;Modalities    PT Goals (Current goals can be found in the Care Plan section)  Acute Rehab PT Goals Patient Stated Goal: return home PT Goal Formulation: With patient/family Time For Goal Achievement: 08/17/24 Potential to Achieve Goals: Good    Frequency 7X/week     Co-evaluation               AM-PAC PT 6 Clicks Mobility  Outcome Measure Help needed turning from your back to your side while in a flat bed without using bedrails?: A Little Help needed moving from lying on your back to sitting on the side of a flat bed without using  bedrails?: A Little Help needed moving to and from a bed to a chair (including a wheelchair)?: A Little Help needed standing up from a chair using your arms (e.g., wheelchair or bedside chair)?: A Little Help needed to walk in hospital room?: A Lot Help needed climbing 3-5 steps with a railing? : A Lot 6 Click Score: 16    End of Session Equipment Utilized During Treatment: Gait belt Activity Tolerance: Patient tolerated treatment well Patient left: in bed;with call bell/phone within reach;with SCD's reapplied;with family/visitor present (SCD was only on R leg at arrival - returned to the same) Nurse Communication: Mobility status PT Visit Diagnosis: Other abnormalities of gait and mobility (R26.89);Muscle weakness (generalized) (M62.81)    Time: 8289-8270 PT Time Calculation (min) (ACUTE ONLY): 19 min   Charges:   PT Evaluation $PT Eval Low Complexity: 1 Low   PT General Charges $$ ACUTE PT VISIT: 1 Visit         Benjiman, PT Acute Rehab [facility] [phone]   Benjiman VEAR Mulberry 08/03/2024, 5:42 PM

## 2024-08-03 NOTE — Anesthesia Preprocedure Evaluation (Addendum)
"                                    Anesthesia Evaluation  Patient identified by MRN, date of birth, ID band Patient awake    Reviewed: Allergy & Precautions, NPO status , Patient's Chart, lab work & pertinent test results  Airway Mallampati: III  TM Distance: >3 FB Neck ROM: Full    Dental  (+) Dental Advisory Given, Teeth Intact   Pulmonary asthma , neg recent URI, Patient abstained from smoking., former smoker   Pulmonary exam normal breath sounds clear to auscultation       Cardiovascular hypertension, Pt. on medications (-) CAD and (-) Cardiac Stents Normal cardiovascular exam Rhythm:Regular Rate:Normal     Neuro/Psych negative neurological ROS  negative psych ROS   GI/Hepatic Neg liver ROS,GERD  Medicated and Controlled,,  Endo/Other  negative endocrine ROS    Renal/GU negative Renal ROS     Musculoskeletal  (+) Arthritis ,    Abdominal  (+) + obese  Peds  Hematology negative hematology ROS (+)   Anesthesia Other Findings   Reproductive/Obstetrics                              Anesthesia Physical Anesthesia Plan  ASA: 2  Anesthesia Plan: Spinal   Post-op Pain Management: Regional block* and Tylenol  PO (pre-op)*   Induction: Intravenous  PONV Risk Score and Plan: Propofol  infusion, Ondansetron  and Midazolam   Airway Management Planned: Natural Airway and Simple Face Mask  Additional Equipment: None  Intra-op Plan:   Post-operative Plan:   Informed Consent: I have reviewed the patients History and Physical, chart, labs and discussed the procedure including the risks, benefits and alternatives for the proposed anesthesia with the patient or authorized representative who has indicated his/her understanding and acceptance.     Dental advisory given  Plan Discussed with: CRNA  Anesthesia Plan Comments:          Anesthesia Quick Evaluation  "

## 2024-08-03 NOTE — Anesthesia Procedure Notes (Signed)
 Spinal  Patient location during procedure: OR Start time: 08/03/2024 1:16 PM End time: 08/03/2024 1:21 PM Reason for block: surgical anesthesia  Staffing Performed: anesthesiologist  Authorized by: Darlyn Rush, MD   Performed by: Darlyn Rush, MD  Preanesthetic Checklist Completed: patient identified, IV checked, site marked, risks and benefits discussed, surgical consent, monitors and equipment checked, pre-op evaluation and timeout performed Spinal Block Patient position: sitting Prep: DuraPrep and site prepped and draped Patient monitoring: heart rate, continuous pulse ox and blood pressure Approach: midline Location: L3-4 Injection technique: single-shot Needle Needle type: Spinocan  Needle gauge: 25 G Needle length: 9 cm  Additional Notes Expiration date of kit checked and confirmed. Patient tolerated procedure well, without complications.

## 2024-08-03 NOTE — Anesthesia Procedure Notes (Signed)
 Anesthesia Regional Block: Adductor canal block   Pre-Anesthetic Checklist: , timeout performed,  Correct Patient, Correct Site, Correct Laterality,  Correct Procedure, Correct Position, site marked,  Risks and benefits discussed,  Surgical consent,  Pre-op evaluation,  At surgeon's request and post-op pain management  Laterality: Lower and Left  Prep: chloraprep       Needles:  Injection technique: Single-shot  Needle Type: Stimiplex     Needle Length: 9cm  Needle Gauge: 21     Additional Needles:   Procedures:,,,, ultrasound used (permanent image in chart),,    Narrative:  Start time: 08/03/2024 11:14 AM End time: 08/03/2024 11:34 AM Injection made incrementally with aspirations every 5 mL.  Performed by: Personally  Anesthesiologist: Darlyn Rush, MD  Additional Notes: BP cuff, EKG monitors applied. Sedation begun. Artery and nerve location verified with ultrasound. Anesthetic injected incrementally (5ml), slowly, and after negative aspirations under direct u/s guidance. Good fascial/perineural spread. Tolerated well.

## 2024-08-03 NOTE — Op Note (Signed)
 [name], [name] MEDICAL RECORD NO: [id number] ACCOUNT NO: [id number] DATE OF BIRTH: 1961-08-07 FACILITY: [facility] LOCATION: [id number] PHYSICIAN: Donnice BIRCH. 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  PROCEDURE:  Revision left total knee arthroplasty.  COMPONENTS USED:  Depuy Attune revision knee system with a size 5 tibial base plate with a 53 press-fit sleeve with a cemented 14 x 50 cemented stem.  A size 6 Attune revision left femoral component with a 14 x 80 cemented stem with a small cone, no  augments.  We used a size 8 revision Attune insert to match the size 6 femur.  SURGEON:  Donnice BIRCH. Ernie, MD  ASSISTANT:  Rosina Calin, PAC.  Note that PA Calin was present for the entirety of the case from preoperative positioning, perioperative management of the operative extremity, general facilitation of the case, and primary wound closure.  ANESTHESIA:  Regional plus spinal.  BLOOD LOSS:  About 350 mL.  DRAINS:  None.  TOURNIQUET:  Not utilized.  INDICATIONS FOR THE PROCEDURE:  The patient is a pleasant 63 year old male with history of index left total knee arthroplasty performed in 2021.  He was referred for evaluation of persistent significant arthrofibrosis with an arc of motion of 40-50  degrees lacking 10 degrees of extension and flexion to about 50-60 degrees.  This was associated with pain at the extremes.  We had a lengthy discussion in the office regarding surgical management of stiff knees.  The primary complication would be  persistent stiffness postoperatively.  Other risks include standard risks of infection, DVT, component failure, and need for future surgeries.  He wished to proceed.  Consent was obtained for benefit of improved motion and  pain.  PROCEDURE IN DETAIL:  The patient was brought to the operative theater.  Once adequate anesthesia, preoperative antibiotics, 2 gm of Ancef  administered as well as tranexamic acid  and Decadron , he was positioned supine.  We did place a proximal thigh tourniquet  but it was not utilized.  His left lower extremity was then prepped and draped sterile fashion.  A timeout was performed identifying the patient, planned procedure, and extremity.  At this point, his old incision was excised creating a more straight  incision over the anterior aspect of the knee.  Soft tissue planes were created.  A median arthrotomy was made encountering significant scar tissue but clear synovial fluid otherwise.  The first portion of the case was carried out as an extensive scar debridement and synovectomy including the entire medial aspect of the joint, the lateral aspect of the joint including the parapatellar region, as well as the suprapatellar pouch.  With this, I was able to flex his knee up to greater than 90 degrees to expose the knee to perform the procedure without the need for additional exposure techniques.  I was able to  remove the old polyethylene insert allowing for further debridement over the posterior aspect of the joint as well as posterior laterally.  Once this was done, we used a thin ACL saw to undermine the bone cement interface of the tibia and was able to  loosen this.  Once this was completed we did the same procedure on the femoral component removing the femoral component with minimal central bone loss.  Once this was done, I subluxed the tibia anteriorly and was  able to remove the tibial component.   Once this was done, we removed excessive cement off the distal femur and proximal tibia.  I then reamed up to 16 mm on both the femoral and tibial canals.  I then used an extramedullary guide on the tibia and resected a small amount of bone removing  excessive cement this way on the proximal tibia as  well as remaining bone.  We then broached up to a 53 mm broach which sat a few millimeters proud of the cut surface.  We then placed a 5 tray into this.  At this point, we placed an intramedullary rod  into the femur for a distal femoral resection.  At this point, we removed approximately 3-4 mm of distal femoral bone.  This was due to his preoperative flexion contracture.  Once this was done, we sized and selected a size 6 femoral component.  We used  this size 6 rotation block based off the proximal tibial cut to set rotation.  The anterior cut was made.  I made the posterior cuts identifying no need for posterior augments.  Chamfer cuts were revisited.  The anterior jig was then pinned into place to make the  box cut and as well as the anterior chamfer cuts.  Once this was all done, we did a trial reduction with a size 6 femoral component with a 14 x 80 cemented trial stem.  During the trial reduction, we determined that we would use at least the 6 or 8 mm  insert.  Given all these findings, we removed all the trial components.  Measured and selected size 5 cement restrictors and placed them at the depth for cemented components.  We then prepared the distal femur for the small porous coated cone.  At this  point, the posterior aspect of the knee was injected with 0.25% Marcaine  with epinephrine , 1 mL of Toradol , and saline.  The final components were opened and configured on the back table by Rosina Calin and under direct supervision.  Once these were  prepared, the knee was irrigated with normal saline solution.  I then placed a gram of vancomycin  powder into the tibia and the femur.  We placed the press-fit cone in its prepared position.  Cement was mixed.  We placed cement into the proximal tibia  and then impacted the final tibial component.  It sat at the level of where the broach sat.  We then placed the femoral component in a cemented technique fully cemented.  The knee was then brought to  extension with an 8 mm insert and allowed the cement  to fully cure.  Once the cement fully cured, excessive cement was removed.  Based on the trial reduction, we selected the size 8 Attune revision insert to match the 6 femur.  It was placed in the tibia.  The knee was reirrigated using a combination of  normal saline solution and Prontosan antimicrobial solution.  At this point, the extensor mechanism was reapproximated with the knee in about 40 degrees of flexion using #1 Vicryl, #1 Stratafix suture.  The remainder of vancomycin  powder was placed into  the superficial aspect of the wound.  The remainder of the wound was closed with 2-0 Vicryl and a running Monocryl stitch.  The wound was cleaned, dried, and dressed sterilely using surgical glue and Aquacel dressing.  Intraoperatively, the patient was  noted to have full knee extension and flexed to at least 90 degrees with his capsule closed.  Once this was done, the patient was brought to the recovery room in stable condition, tolerated the procedure well.  Findings reviewed with the family.   NIK D: 08/03/2024 3:44:50 pm T: 08/03/2024 9:00:00 pm  JOB: [id number]/ [id number]

## 2024-08-03 NOTE — Interval H&P Note (Signed)
 History and Physical Interval Note:  08/03/2024 10:08 AM  Joe [name]  has presented today for surgery, with the diagnosis of Failed left total knee arthroplasty.  The various methods of treatment have been discussed with the patient and family. After consideration of risks, benefits and other options for treatment, the patient has consented to  Procedures: TOTAL KNEE REVISION (Left) as a surgical intervention.  The patient's history has been reviewed, patient examined, no change in status, stable for surgery.  I have reviewed the patient's chart and labs.  Questions were answered to the patient's satisfaction.     [name]

## 2024-08-03 NOTE — Brief Op Note (Signed)
 08/03/2024  3:34 PM  PATIENT:  Joe [name]  63 y.o. 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   Delay start of Pharmacological VTE agent (>24hrs) due to surgical blood loss or risk of bleeding: no

## 2024-08-03 NOTE — Anesthesia Procedure Notes (Signed)
 Procedure Name: MAC Date/Time: 08/03/2024 1:16 PM  Performed by: Nada Corean CROME, CRNAPre-anesthesia Checklist: Patient identified, Emergency Drugs available, Suction available, Patient being monitored and Timeout performed Patient Re-evaluated:Patient Re-evaluated prior to induction Oxygen Delivery Method: Simple face mask Preoxygenation: Pre-oxygenation with 100% oxygen Induction Type: IV induction Ventilation: Nasal airway inserted- appropriate to patient size Placement Confirmation: positive ETCO2 Dental Injury: Teeth and Oropharynx as per pre-operative assessment

## 2024-08-03 NOTE — H&P (Signed)
 TOTAL KNEE REVISION ADMISSION H&P  Patient is being admitted for left revision total knee arthroplasty.  Subjective:  Chief Complaint:left knee pain.  HPI: Joe [name], 63 y.o. male. He initially had his knee replaced by Dr. Gerome in 2021. He was never able to flex past 90 degrees, but felt this got worse after his right knee replacement in December 2021. His limited ROM has had a significant impact on his quality of life and he discussed treatment options with Dr. Ernie. Based on the limitations in his knee, they decided on total knee revision.   Patient Active Problem List   Diagnosis Date Noted   Osteoarthritis of left knee 10/01/2019   Past Medical History:  Diagnosis Date   Arthritis    Asthma    History of kidney stones    Hypercholesteremia    Hypertension    Seasonal allergies    Ureterolithiasis     Past Surgical History:  Procedure Laterality Date   BACK SURGERY     CARPAL TUNNEL RELEASE     EXTRACORPOREAL SHOCK WAVE LITHOTRIPSY Right 10/22/2018   Procedure: EXTRACORPOREAL SHOCK WAVE LITHOTRIPSY (ESWL);  Surgeon: Nieves Cough, MD;  Location: [facility];  Service: Urology;  Laterality: Right;   KNEE SURGERY     SHOULDER ARTHROSCOPY Right    TOTAL KNEE ARTHROPLASTY Left 10/01/2019   Procedure: TOTAL KNEE ARTHROPLASTY;  Surgeon: Gerome Charleston, MD;  Location: [facility];  Service: Orthopedics;  Laterality: Left;  with adductor canal   TOTAL KNEE ARTHROPLASTY Right     No current facility-administered medications for this encounter.   Current Outpatient Medications  Medication Sig Dispense Refill Last Dose/Taking   albuterol  (VENTOLIN  HFA) 108 (90 Base) MCG/ACT inhaler Inhale 2 puffs into the lungs every 6 (six) hours as needed for shortness of breath or wheezing.   Taking As Needed   amLODipine  (NORVASC ) 10 MG tablet Take 10 mg by mouth daily.   Taking   losartan  (COZAAR ) 50 MG tablet Take 50 mg by mouth daily.   Taking   montelukast  (SINGULAIR ) 10 MG tablet Take  10 mg by mouth at bedtime.   Taking   rosuvastatin  (CRESTOR ) 5 MG tablet Take 5 mg by mouth daily before breakfast.    Taking   methocarbamol  (ROBAXIN ) 500 MG tablet Take 1 tablet (500 mg total) by mouth 4 (four) times daily. (Patient not taking: Reported on 07/20/2024) 40 tablet 0 Not Taking   tamsulosin  (FLOMAX ) 0.4 MG CAPS capsule Take 1 tablet daily until kidney stone passes (Patient not taking: Reported on 09/21/2019) 20 capsule 0    Allergies[1]  Social History   Tobacco Use   Smoking status: Former    Current packs/day: 0.25    Average packs/day: 0.3 packs/day for 15.0 years (3.8 ttl pk-yrs)    Types: Cigarettes   Smokeless tobacco: Current    Types: Chew   Tobacco comments:    1-2 cigarettes a day  Substance Use Topics   Alcohol use: Yes    Alcohol/week: 7.0 standard drinks of alcohol    Types: 7 Standard drinks or equivalent per week    Comment: occ    No family history on file.    Review of Systems  Constitutional:  Negative for chills and fever.  Respiratory:  Negative for cough and shortness of breath.   Cardiovascular:  Negative for chest pain.  Gastrointestinal:  Negative for nausea and vomiting.  Musculoskeletal:  Positive for arthralgias.      Objective:  Physical Exam Left knee  exam: Surgical incisions healed without erythema warmth or palpable effusion His arc of motion is limited lacking 10 degrees of extension and flexion between 50 and 60 degrees. Mild lower extremity edema No significant pain involving his left hip with range of motion, no groin pain or referred pain Right knee exam: Well-healed surgical incision Slight flexion contracture with flexion over 110 degrees  Vital signs in last 24 hours:    Labs:  Estimated body mass index is 34.41 kg/m as calculated from the following:   Height as of 07/22/24: 5' 9 (1.753 m).   Weight as of 07/22/24: 105.7 kg.  Imaging Review  Imaging: Today I reviewed previously performed radiographs of his  left knee. His femoral and tibial components appear to be stable and well-fixed without evidence of complication or concern. There is no obvious signs of loosening or other obvious signs for explanation of his lack of motion.  Bone scan of his left knee was performed on 03/08/2024. This was done to rule out infection or loosening as a source of his pain and stiffness. Bone scan images were reviewed on canopy. The gender will result as determined by the radiologist was that there was no significant findings to suggest infection or loosening of both of the knees. Mild uptake around the left knee as compared to the right.   Assessment/Plan:  Failed left total knee arthroplasty   The patient history, physical examination, clinical judgment of the provider and imaging studies are consistent with end stage degenerative joint disease of the left knee(s), previous total knee arthroplasty. Revision total knee arthroplasty is deemed medically necessary. The treatment options including medical management, injection therapy, arthroscopy and revision arthroplasty were discussed at length. The risks and benefits of revision total knee arthroplasty were presented and reviewed. The risks due to aseptic loosening, infection, stiffness, patella tracking problems, thromboembolic complications and other imponderables were discussed. The patient acknowledged the explanation, agreed to proceed with the plan and consent was signed. Patient is being admitted for inpatient treatment for surgery, pain control, PT, OT, prophylactic antibiotics, VTE prophylaxis, progressive ambulation and ADL's and discharge planning.The patient is planning to be discharged home.  Rosina Calin, PA-C Orthopedic Surgery [facility] [place] [phone]        [1]  Allergies Allergen Reactions   Pollen Extract Other (See Comments)

## 2024-08-03 NOTE — Discharge Instructions (Signed)

## 2024-08-03 NOTE — Transfer of Care (Signed)
 Immediate Anesthesia Transfer of Care Note  Patient: Joe [name]  Procedure(s) Performed: TOTAL KNEE REVISION (Left: Knee)  Patient Location: PACU  Anesthesia Type:MAC, Regional, and Spinal  Level of Consciousness: awake, alert , oriented, and patient cooperative  Airway & Oxygen Therapy: Patient Spontanous Breathing and Patient connected to face mask oxygen  Post-op Assessment: Report given to RN and Post -op Vital signs reviewed and stable  Post vital signs: Reviewed and stable  Last Vitals:  Vitals Value Taken Time  BP 113/68 08/03/24 15:40  Temp    Pulse 68 08/03/24 15:43  Resp 19 08/03/24 15:43  SpO2 94 % 08/03/24 15:43  Vitals shown include unfiled device data.  Last Pain:  Vitals:   08/03/24 1150  TempSrc:   PainSc: 0-No pain         Complications: No notable events documented.

## 2024-08-04 ENCOUNTER — Encounter (HOSPITAL_COMMUNITY): Payer: Self-pay | Admitting: Orthopedic Surgery

## 2024-08-04 ENCOUNTER — Other Ambulatory Visit (HOSPITAL_COMMUNITY): Payer: Self-pay

## 2024-08-04 DIAGNOSIS — T84093A Other mechanical complication of internal left knee prosthesis, initial encounter: Secondary | ICD-10-CM | POA: Diagnosis not present

## 2024-08-04 LAB — BASIC METABOLIC PANEL WITH GFR
Anion gap: 11 (ref 5–15)
BUN: 23 mg/dL (ref 8–23)
CO2: 23 mmol/L (ref 22–32)
Calcium: 8.8 mg/dL — ABNORMAL LOW (ref 8.9–10.3)
Chloride: 102 mmol/L (ref 98–111)
Creatinine, Ser: 1 mg/dL (ref 0.61–1.24)
GFR, Estimated: >60 mL/min
Glucose, Bld: 179 mg/dL — ABNORMAL HIGH (ref 70–99)
Potassium: 4.4 mmol/L (ref 3.5–5.1)
Sodium: 135 mmol/L (ref 135–145)

## 2024-08-04 LAB — CBC
HCT: 39.2 % (ref 39.0–52.0)
Hemoglobin: 13.6 g/dL (ref 13.0–17.0)
MCH: 30.2 pg (ref 26.0–34.0)
MCHC: 34.7 g/dL (ref 30.0–36.0)
MCV: 87.1 fL (ref 80.0–100.0)
Platelets: 206 K/uL (ref 150–400)
RBC: 4.5 MIL/uL (ref 4.22–5.81)
RDW: 12.2 % (ref 11.5–15.5)
WBC: 12 K/uL — ABNORMAL HIGH (ref 4.0–10.5)
nRBC: 0 % (ref 0.0–0.2)

## 2024-08-04 MED ORDER — ACETAMINOPHEN 500 MG PO TABS: 1000.0000 mg | ORAL_TABLET | Freq: Four times a day (QID) | ORAL | Status: AC

## 2024-08-04 MED ORDER — OXYCODONE HCL 5 MG PO TABS
5.0000 mg | ORAL_TABLET | ORAL | 0 refills | Status: AC | PRN
  Filled 2024-08-04: qty 42, 7d supply, fill #0

## 2024-08-04 MED ORDER — POLYETHYLENE GLYCOL 3350 17 GM/SCOOP PO POWD
17.0000 g | Freq: Two times a day (BID) | ORAL | 0 refills | Status: AC
  Filled 2024-08-04: qty 238, 7d supply, fill #0

## 2024-08-04 MED ORDER — ASPIRIN 81 MG PO CHEW
81.0000 mg | CHEWABLE_TABLET | Freq: Two times a day (BID) | ORAL | 0 refills | Status: AC
  Filled 2024-08-04: qty 56, 28d supply, fill #0

## 2024-08-04 MED ORDER — CYCLOBENZAPRINE HCL 5 MG PO TABS
5.0000 mg | ORAL_TABLET | Freq: Three times a day (TID) | ORAL | 0 refills | Status: AC | PRN
  Filled 2024-08-04: qty 40, 7d supply, fill #0

## 2024-08-04 MED ORDER — KETOROLAC TROMETHAMINE 15 MG/ML IJ SOLN: 7.5000 mg | Freq: Four times a day (QID) | INTRAMUSCULAR | Status: DC

## 2024-08-04 MED ORDER — SENNA 8.6 MG PO TABS
2.0000 | ORAL_TABLET | Freq: Every day | ORAL | 0 refills | Status: AC
  Filled 2024-08-04: qty 28, 14d supply, fill #0

## 2024-08-04 NOTE — Progress Notes (Signed)
 Patient was given discharge instructions by [facility] RN, and all questions were answered. Patient was stable for discharge and was taken to the main exit by wheelchair.

## 2024-08-04 NOTE — TOC Initial Note (Signed)
 Transition of Care [facility]) - Initial/Assessment Note    Patient Details  Name: Joe [name] MRN: [id number] Date of Birth: 1960/12/10  Transition of Care [facility]) CM/SW Contact:    Alfonse JONELLE Rex, RN Phone Number: 08/04/2024, 9:56 AM  Clinical Narrative:     Met with patient at bedside to review dc therapy and home equipment needs, patient confirmed OPPT, RW delivered to bedside by [employer]. No INPT CM needs                 Barriers to Discharge: No Barriers Identified   Patient Goals and CMS Choice            Expected Discharge Plan and Services         Expected Discharge Date: 08/04/24                                    Prior Living Arrangements/Services                       Activities of Daily Living   ADL Screening (condition at time of admission) Independently performs ADLs?: Yes (appropriate for developmental age) Is the patient deaf or have difficulty hearing?: No Does the patient have difficulty seeing, even when wearing glasses/contacts?: No Does the patient have difficulty concentrating, remembering, or making decisions?: No  Permission Sought/Granted                  Emotional Assessment              Admission diagnosis:  Failed total left knee replacement, initial encounter [T84.093A] S/P revision of total knee, left [Z96.652] Patient Active Problem List   Diagnosis Date Noted   S/P revision of total knee, left 08/03/2024   Osteoarthritis of left knee 10/01/2019   PCP:  Arloa Elsie JONELLE, MD Pharmacy:   [id number] - [place], [place] - [address] AT [facility] [address] [place] [address] Phone: [phone] Fax: [phone]  [id number] - [place], [place] - [address] AT [facility] OF [address] [place] [address] Phone: [phone] Fax: [phone]  [facility] - [place], [place] - [address] [place] [address] Phone: [phone] Fax: [phone]     Social Drivers of Health (SDOH) Social History: SDOH Screenings   Food Insecurity: No Food Insecurity (08/03/2024)  Housing: Low Risk (08/03/2024)  Transportation Needs: No Transportation Needs (08/03/2024)  Utilities: Not At Risk (08/03/2024)  Tobacco Use: High Risk (08/03/2024)   SDOH Interventions:     Readmission Risk Interventions     No data to display

## 2024-08-04 NOTE — Progress Notes (Signed)
 Discharge medication delivered to pt at bedside.

## 2024-08-04 NOTE — Progress Notes (Signed)
 Patient ID: Joe [name], male   DOB: 1961/05/15, 63 y.o.   MRN: [id number] Subjective: 1 Day Post-Op Procedures (LRB): TOTAL KNEE REVISION (Left)    Patient reports pain as moderate.  Primary complaint is left foot pain, knee doing relatively well  Objective:   VITALS:   Vitals:   08/04/24 0207 08/04/24 0610  BP: (!) 140/88 137/83  Pulse: 70 68  Resp: 16 18  Temp: 97.7 F (36.5 C) 97.7 F (36.5 C)  SpO2: 95% 97%    Neurovascular intact Incision: dressing C/D/I No erythema or significant left foot swelling NVI with normal ankle motion  LABS Recent Labs    08/04/24 0345  HGB 13.6  HCT 39.2  WBC 12.0*  PLT 206    Recent Labs    08/04/24 0345  NA 135  K 4.4  BUN 23  CREATININE 1.00  GLUCOSE 179*    No results for input(s): LABPT, INR in the last 72 hours.   Assessment/Plan: 1 Day Post-Op Procedures (LRB): TOTAL KNEE REVISION (Left)   Advance diet Up with therapy Home today after therapy We will have meds delivered to his room prior to discharge Goals and activity reviewed RTC in 2 weeks Toradol  until discharge to help with foot/ankle pain

## 2024-08-04 NOTE — Progress Notes (Signed)
 Physical Therapy Treatment Patient Details Name: Joe [name] MRN: [id number] DOB: September 01, 1960 Today's Date: 08/04/2024   History of Present Illness Pt is 63 yo male admitted on 08/03/24 for L TKA revision. Pt with hx including but not limited to arthritis, HTN, HCL, back sx (2025), R TKA, L TKA 2021.    PT Comments  Pt is POD # 1 and is progressing well.  Pt familiar with rehab progression and safety from prior TKA and has outpt PT scheduled next week.  He has good ROM, pain control, and quad activation.  Pt ambulating 100' and performed stairs similar to home set up. Pt demonstrates safe gait & transfers in order to return home from PT perspective once discharged by MD.  While in hospital, will continue to benefit from PT for skilled therapy to advance mobility and exercises.       If plan is discharge home, recommend the following: A little help with walking and/or transfers;A little help with bathing/dressing/bathroom;Assistance with cooking/housework;Help with stairs or ramp for entrance   Can travel by private vehicle        Equipment Recommendations  Rolling walker (2 wheels)    Recommendations for Other Services       Precautions / Restrictions Precautions Precautions: Fall;Knee Restrictions LLE Weight Bearing Per Provider Order: Weight bearing as tolerated     Mobility  Bed Mobility Overal bed mobility: Needs Assistance Bed Mobility: Supine to Sit, Sit to Supine     Supine to sit: Supervision Sit to supine: Supervision        Transfers Overall transfer level: Needs assistance Equipment used: Rolling walker (2 wheels) Transfers: Sit to/from Stand Sit to Stand: Supervision                Ambulation/Gait Ambulation/Gait assistance: Supervision Gait Distance (Feet): 100 Feet Assistive device: Rolling walker (2 wheels) Gait Pattern/deviations: Step-through pattern, Decreased stride length, Decreased weight shift to left       General Gait Details:  steady gait; good RW proximity   Stairs Stairs: Yes Stairs assistance: Contact guard assist   Number of Stairs: 6 General stair comments: started with bil rails forward to simulate entry steps - tolerated well; progressed to sideways with single rail to simulate stairs in home- tolerated well   Wheelchair Mobility     Tilt Bed    Modified Rankin (Stroke Patients Only)       Balance Overall balance assessment: Needs assistance Sitting-balance support: No upper extremity supported Sitting balance-Leahy Scale: Normal     Standing balance support: No upper extremity supported Standing balance-Leahy Scale: Good Standing balance comment: RW to ambulate but could stand without UE support                            Communication    Cognition Arousal: Alert Behavior During Therapy: WFL for tasks assessed/performed   PT - Cognitive impairments: No apparent impairments                                Cueing    Exercises Total Joint Exercises Ankle Circles/Pumps: AROM, Both, 10 reps, Supine Quad Sets: AROM, Both, 10 reps, Supine Heel Slides: AROM, Left, 5 reps, Supine Hip ABduction/ADduction: AROM, Left, 5 reps, Supine Long Arc Quad: AROM, Left, 5 reps, Seated Knee Flexion: AROM, Left, 5 reps, Seated Goniometric ROM: L knee 5 to 80 degrees    General  Comments   Educated on safe ice use, no pivots, car transfers, resting with leg straight, and TED hose during day. Also, encouraged walking every 1-2 hours during day. Educated on HEP with focus on mobility the first weeks. Discussed doing exercises within pain control and if pain increasing could decreased ROM, reps, and stop exercises as needed. Encouraged to perform quad sets and ankle pumps frequently for blood flow and to promote full knee extension.      Pertinent Vitals/Pain Pain Assessment Pain Assessment: 0-10 Pain Score: 2  Pain Location: L knee Pain Descriptors / Indicators: Sore Pain  Intervention(s): Limited activity within patient's tolerance, Monitored during session, Premedicated before session, Ice applied    Home Living                          Prior Function            PT Goals (current goals can now be found in the care plan section) Progress towards PT goals: Progressing toward goals    Frequency    7X/week      PT Plan      Co-evaluation              AM-PAC PT 6 Clicks Mobility   Outcome Measure  Help needed turning from your back to your side while in a flat bed without using bedrails?: None Help needed moving from lying on your back to sitting on the side of a flat bed without using bedrails?: None Help needed moving to and from a bed to a chair (including a wheelchair)?: A Little Help needed standing up from a chair using your arms (e.g., wheelchair or bedside chair)?: A Little Help needed to walk in hospital room?: A Little Help needed climbing 3-5 steps with a railing? : A Little 6 Click Score: 20    End of Session Equipment Utilized During Treatment: Gait belt Activity Tolerance: Patient tolerated treatment well Patient left: in bed;with call bell/phone within reach;with family/visitor present Nurse Communication: Mobility status PT Visit Diagnosis: Other abnormalities of gait and mobility (R26.89);Muscle weakness (generalized) (M62.81)     Time: 9090-9062 PT Time Calculation (min) (ACUTE ONLY): 28 min  Charges:    $Gait Training: 8-22 mins $Therapeutic Exercise: 8-22 mins PT General Charges $$ ACUTE PT VISIT: 1 Visit                     Benjiman, PT Acute Rehab [facility] [phone]    Benjiman VEAR Mulberry 08/04/2024, 10:40 AM

## 2024-08-08 NOTE — Discharge Summary (Signed)
 Patient ID: Joe Joe [name] MRN: [id number] DOB/AGE: [birth year] 63 y.o.  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 male who was admitted 08/03/2024 for operative treatment ofS/P revision of total knee, left. Patient has severe unremitting pain that affects sleep, daily activities, and work/hobbies. After pre-op clearance the patient was taken to the operating room on 08/03/2024 and underwent  Procedures: TOTAL KNEE REVISION.    Patient was given perioperative antibiotics:  Anti-infectives (From admission, onward)    Start     Dose/Rate Route Frequency Ordered Stop   08/03/24 2000  ceFAZolin  (ANCEF ) IVPB 2g/100 mL premix        2 g 200 mL/hr over 30 Minutes Intravenous Every 6 hours 08/03/24 1649 08/04/24 0913   08/03/24 1500  vancomycin  (VANCOCIN ) powder  Status:  Discontinued          As needed 08/03/24 1500 08/03/24 1540   08/03/24 1000  ceFAZolin  (ANCEF ) IVPB 2g/100 mL premix        2 g 200 mL/hr over 30 Minutes Intravenous On call to O.R. 08/03/24 0948 08/03/24 1342        Patient was given sequential compression devices, early ambulation, and chemoprophylaxis to prevent DVT. Patient worked with PT and was meeting their goals regarding safe ambulation and transfers.  Patient benefited maximally from hospital stay and there were Joe [name] complications.    Recent vital signs: Joe [name] data found.   Recent laboratory studies: Joe [name] results for input(s): WBC, HGB, HCT, PLT, NA, K,  CL, CO2, BUN, CREATININE, GLUCOSE, INR, CALCIUM  in the last 72 hours.  Invalid input(s): PT, 2   Discharge Medications:   Allergies as of 08/04/2024       Reactions   Pollen Extract Other (See Comments)        Medication List     STOP taking these medications    methocarbamol  500 MG tablet Commonly known as: Robaxin        TAKE these medications    acetaminophen  500 MG tablet Commonly known as: TYLENOL  Take 2 tablets (1,000 mg total) by mouth every 6 (six) hours.   albuterol  108 (90 Base) MCG/ACT inhaler Commonly known as: VENTOLIN  HFA Inhale 2 puffs into the lungs every 6 (six) hours as needed for shortness of breath or wheezing.   amLODipine  10 MG tablet Commonly known as: NORVASC  Take 10 mg by mouth daily.   Aspirin  Low Dose 81 MG chewable tablet Generic drug: aspirin  Chew 1 tablet (81 mg total) by mouth 2 (two) times daily for 28 days.   cyclobenzaprine  5 MG tablet Commonly known as: FLEXERIL  Take 1-2 tablets (5-10 mg total) by mouth 3 (three) times daily as needed for muscle spasms.   losartan  50 MG tablet Commonly known as: COZAAR  Take 50 mg by mouth daily.   montelukast  10 MG tablet Commonly known as: SINGULAIR  Take 10 mg by mouth at bedtime.   mupirocin  ointment 2 % Commonly known as: BACTROBAN  Place 1 Application into the nose 2 (  two) times daily for 60 doses. Use as directed 2 times daily for 5 days every other week for 6 weeks.   oxyCODONE  5 MG immediate release tablet Commonly known as: Oxy IR/ROXICODONE  Take 1 tablet (5 mg total) by mouth every 4 (four) hours as needed for severe pain (pain score 7-10).   polyethylene glycol powder 17 GM/SCOOP powder Commonly known as: GLYCOLAX /MIRALAX  Take 17 g by mouth 2 (two) times daily. Dissolve 1 capful (17g) in 4-8 ounces of liquid and take by mouth twice daily.   rosuvastatin  5 MG tablet Commonly known as: CRESTOR  Take 5 mg by mouth daily before breakfast.   senna 8.6 MG Tabs  tablet Commonly known as: SENOKOT Take 2 tablets (17.2 mg total) by mouth at bedtime for 14 days.   tamsulosin  0.4 MG Caps capsule Commonly known as: FLOMAX  Take 1 tablet daily until kidney stone passes               Discharge Care Instructions  (From admission, onward)           Start     Ordered   08/04/24 0000  Change dressing       Comments: Maintain surgical dressing until follow up in the clinic. If the edges start to pull up, may reinforce with tape. If the dressing is Joe [name] longer working, may remove and cover with gauze and tape, but must keep the area dry and clean.  Call with any questions or concerns.   08/04/24 0752            Diagnostic Studies: Joe [name] results found.  Disposition: Discharge disposition: 01-Home or Self Care       Discharge Instructions     Call MD / Call 911   Complete by: As directed    If you experience chest pain or shortness of breath, CALL 911 and be transported to the hospital emergency room.  If you develope a fever above 101 F, pus (white drainage) or increased drainage or redness at the wound, or calf pain, call your surgeon's office.   Change dressing   Complete by: As directed    Maintain surgical dressing until follow up in the clinic. If the edges start to pull up, may reinforce with tape. If the dressing is Joe [name] longer working, may remove and cover with gauze and tape, but must keep the area dry and clean.  Call with any questions or concerns.   Constipation Prevention   Complete by: As directed    Drink plenty of fluids.  Prune juice may be helpful.  You may use a stool softener, such as Colace (over the counter) 100 mg twice a day.  Use MiraLax  (over the counter) for constipation as needed.   Diet - low sodium heart healthy   Complete by: As directed    Increase activity slowly as tolerated   Complete by: As directed    Weight bearing as tolerated with assist device (walker, cane, etc) as directed, use it as long as  suggested by your surgeon or therapist, typically at least 4-6 weeks.   Post-operative opioid taper instructions:   Complete by: As directed    POST-OPERATIVE OPIOID TAPER INSTRUCTIONS: It is important to wean off of your opioid medication as soon as possible. If you do not need pain medication after your surgery it is ok to stop day one. Opioids include: Codeine, Hydrocodone (Norco, Vicodin), Oxycodone (Percocet, oxycontin ) and hydromorphone  amongst others.  Long term and even short term use of opiods can cause:  Increased pain response Dependence Constipation Depression Respiratory depression And more.  Withdrawal symptoms can include Flu like symptoms Nausea, vomiting And more Techniques to manage these symptoms Hydrate well Eat regular healthy meals Stay active Use relaxation techniques(deep breathing, meditating, yoga) Do Not substitute Alcohol to help with tapering If you have been on opioids for less than two weeks and do not have pain than it is ok to stop all together.  Plan to wean off of opioids This plan should start within one week post op of your joint replacement. Maintain the same interval or time between taking each dose and first decrease the dose.  Cut the total daily intake of opioids by one tablet each day Next start to increase the time between doses. The last dose that should be eliminated is the evening dose.      TED hose   Complete by: As directed    Use stockings (TED hose) for 2 weeks on both leg(s).  You may remove them at night for sleeping.        Follow-up Information     Ernie Cough, MD. Schedule an appointment as soon as possible for a visit in 2 week(s).   Specialty: Orthopedic Surgery Contact information: [address] [place] [address] [phone]                  Signed: Rosina JONELLE Calin 08/08/2024, 10:29 AM
